# Patient Record
Sex: Female | Born: 1950 | Race: White | Hispanic: No | Smoking: Former smoker
Health system: Southern US, Community
[De-identification: ages and names within clinical notes are randomized; demographics above are authoritative.]

## PROBLEM LIST (undated history)

## (undated) DIAGNOSIS — M1990 Unspecified osteoarthritis, unspecified site: Secondary | ICD-10-CM

## (undated) DIAGNOSIS — H9190 Unspecified hearing loss, unspecified ear: Secondary | ICD-10-CM

## (undated) HISTORY — PX: ABDOMINAL HYSTERECTOMY: SHX81

## (undated) HISTORY — PX: COLONOSCOPY: SHX174

---

## 2019-07-29 ENCOUNTER — Ambulatory Visit (INDEPENDENT_AMBULATORY_CARE_PROVIDER_SITE_OTHER): Payer: Medicare Other | Admitting: Physician Assistant

## 2019-07-29 ENCOUNTER — Encounter: Payer: Self-pay | Admitting: Physician Assistant

## 2019-07-29 ENCOUNTER — Other Ambulatory Visit: Payer: Self-pay

## 2019-07-29 DIAGNOSIS — L820 Inflamed seborrheic keratosis: Secondary | ICD-10-CM

## 2019-07-29 DIAGNOSIS — Z1283 Encounter for screening for malignant neoplasm of skin: Secondary | ICD-10-CM

## 2019-07-29 NOTE — Progress Notes (Signed)
   Follow-Up Visit   Subjective  Jessica Hardy is a 69 y.o. female who presents for the following: Annual Exam (no new concerns).   The following portions of the chart were reviewed this encounter and updated as appropriate: Tobacco  Allergies  Meds  Problems  Med Hx  Surg Hx  Fam Hx      Objective  Well appearing patient in no apparent distress; mood and affect are within normal limits.  A full examination was performed including scalp, head, eyes, ears, nose, lips, neck, chest, axillae, abdomen, back, buttocks, bilateral upper extremities, bilateral lower extremities, hands, feet, fingers, toes, fingernails, and toenails. All findings within normal limits unless otherwise noted below.  Objective  Head - Anterior (Face) (2), Left Buccal Cheek , Left Parotid Area, Left Posterior Mandible, Right Anterior Mandible, Right Parotid Area (2): Erythematous stuck-on, waxy papule or plaque.    Assessment & Plan  Inflamed seborrheic keratosis (8) Head - Anterior (Face) (2); Left Parotid Area; Right Parotid Area (2); Left Buccal Cheek ; Left Posterior Mandible; Right Anterior Mandible  Destruction of lesion - Head - Anterior (Face) (2), Left Buccal Cheek , Left Parotid Area, Left Posterior Mandible, Right Anterior Mandible, Right Parotid Area (2) Complexity: simple   Destruction method: cryotherapy   Informed consent: discussed and consent obtained   Timeout:  patient name, date of birth, surgical site, and procedure verified Lesion destroyed using liquid nitrogen: Yes   Cryotherapy cycles:  1 Outcome: patient tolerated procedure well with no complications   Post-procedure details: wound care instructions given    No atypical nevi noted at the time of the visit.

## 2019-07-29 NOTE — Patient Instructions (Signed)

## 2020-07-14 ENCOUNTER — Encounter: Payer: Self-pay | Admitting: Orthopaedic Surgery

## 2020-07-14 ENCOUNTER — Ambulatory Visit (INDEPENDENT_AMBULATORY_CARE_PROVIDER_SITE_OTHER): Payer: Medicare Other

## 2020-07-14 ENCOUNTER — Ambulatory Visit (INDEPENDENT_AMBULATORY_CARE_PROVIDER_SITE_OTHER): Payer: Medicare Other | Admitting: Orthopaedic Surgery

## 2020-07-14 DIAGNOSIS — M1711 Unilateral primary osteoarthritis, right knee: Secondary | ICD-10-CM | POA: Diagnosis not present

## 2020-07-14 DIAGNOSIS — G8929 Other chronic pain: Secondary | ICD-10-CM | POA: Diagnosis not present

## 2020-07-14 DIAGNOSIS — M25561 Pain in right knee: Secondary | ICD-10-CM | POA: Diagnosis not present

## 2020-07-14 NOTE — Progress Notes (Signed)
Office Visit Note   Patient: Jessica Hardy           Date of Birth: July 11, 1950           MRN: [id number] Visit Date: 07/14/2020              Requested by: No referring provider defined for this encounter. PCP: Patient, No Pcp Per   Assessment & Plan: Visit Diagnoses:  1. Primary osteoarthritis of right knee     Plan: Patient is failed conservative treatment which is included time, NSAIDs, cortisone injections and supplemental injections in the right knee and still having pain that affects her activities of daily living.  Recommend right total knee arthroplasty.  Patient is agreeable.  Risk benefits of surgery discussed with patient at length by Dr. Magnus Ivan and myself.  Risk reviewed with the patient and her husband is present throughout the exam and include but are not limited to DVT/PE, wound healing problems, prolonged pain, infection, nerve vessel injury, and blood loss.  She wishes to proceed with surgery in the near future.  We will work on scheduling her.  See her back 2 weeks.  Knee model was shown to the patient by Dr. Magnus Ivan today.  Follow-Up Instructions: Return 2 weeks postop.   Orders:  Orders Placed This Encounter  Procedures  . XR Knee 1-2 Views Right   No orders of the defined types were placed in this encounter.     Procedures: No procedures performed   Clinical Data: No additional findings.   Subjective: Chief Complaint  Patient presents with  . Right Knee - Pain    HPI Jessica Hardy is a pleasant 70 year old female were seen for the first time for right knee pain has been ongoing for approximately 10 years.  She has had cortisone injections in her knees and also supplemental injections.  She states these really were not beneficial she had significant pain after the gel injection.  She is taken meloxicam for her knee but is having problems now with the knee not straightening completely.  She has daily pain in the knee.  She does work on her farm  raising vegetables implants.  She uses no assistive device to ambulate.  She has had no known injury to the knee.  Overall healthy with some hypercholesterolemia.  History of hysterectomy without issues.  Otherwise no known medical problems.  She does state that she is sees her primary care doctor yearly.  Review of Systems Negative for fevers chills.  Please see HPI  Objective: Vital Signs: There were no vitals taken for this visit.  Physical Exam General: Well-developed well-nourished pleasant female in no acute distress. Psych: Alert and oriented x3 Ortho Exam Right knee she lacks full extension by 3 to 5 degrees.  She has full flexion.  No instability with stressing of the knee.  Slight patellofemoral crepitus right knee.  Slight effusion right knee.  No abnormal warmth erythema of the right knee.  Left knee normal exam with full range of motion without pain. Specialty Comments:  No specialty comments available.  Imaging: XR Knee 1-2 Views Right  Result Date: 07/14/2020 Right knee 2 views: No acute fractures.  Severe patellar femoral joint. Near bone on bone medial compartment. Knee is well located.     PMFS History: Patient Active Problem List   Diagnosis Date Noted  . Primary osteoarthritis of right knee 07/14/2020   No past medical history on file.  No family history on file.  No past  surgical history on file. Social History   Occupational History  . Not on file  Tobacco Use  . Smoking status: Former Smoker    Quit date: 2014    Years since quitting: 8.1  . Smokeless tobacco: Never Used  Vaping Use  . Vaping Use: Never used  Substance and Sexual Activity  . Alcohol use: Never  . Drug use: Never  . Sexual activity: Not on file

## 2020-08-12 ENCOUNTER — Other Ambulatory Visit: Payer: Self-pay

## 2020-08-15 ENCOUNTER — Other Ambulatory Visit: Payer: Self-pay | Admitting: Physician Assistant

## 2020-08-19 ENCOUNTER — Other Ambulatory Visit: Payer: Self-pay | Admitting: Physician Assistant

## 2020-08-19 NOTE — Progress Notes (Addendum)
Surgical Instructions    Your procedure is scheduled on Tuesday, 08/24/20.  Report to [facility] Main Entrance "A" at 12:30 P.M., then check in with the Admitting office.  Call this number if you have problems the morning of surgery:  [phone]   If you have any questions prior to your surgery date call [phone]: Open Monday-Friday 8am-4pm    Remember:  Do not eat after midnight the night before your surgery -Monday  You may drink clear liquids until 11:30 AM the morning of your surgery-Tuesday.   Clear liquids allowed are: Water, Non-Citrus Juices (without pulp), Carbonated Beverages, Clear Tea, Black Coffee Only, and Gatorade   Please complete your PRE-SURGERY ENSURE that was provided to you by 11:30 AM the morning of surgery.  Please, if able, drink it in one setting. DO NOT SIP.   Take these medicines the morning of surgery with A SIP OF WATER: acyclovir (ZOVIRAX) if needed simvastatin (ZOCOR)  estradiol (ESTRACE)   As of today, STOP taking any Aspirin (unless otherwise instructed by your surgeon) Aleve, Naproxen, Ibuprofen, Motrin, Advil, Goody's, BC's, all herbal medications, fish oil, and all vitamins.                     Do not wear jewelry, make up, or nail polish            Do not wear lotions, powders, perfumes, or deodorant.            Do not shave 48 hours prior to surgery.            Do not bring valuables to the hospital.            [facility] is not responsible for any belongings or valuables.  Do NOT Smoke (Tobacco/Vaping) or drink Alcohol 24 hours prior to your procedure If you use a CPAP at night, you may bring all equipment for your overnight stay.   Contacts, glasses, dentures or bridgework may not be worn into surgery, please bring cases for these belongings   For patients admitted to the hospital, discharge time will be determined by your treatment team.     Special instructions:   [facility]- Preparing For Surgery  Before surgery, you can  play an important role. Because skin is not sterile, your skin needs to be as free of germs as possible. You can reduce the number of germs on your skin by washing with CHG (chlorahexidine gluconate) Soap before surgery.  CHG is an antiseptic cleaner which kills germs and bonds with the skin to continue killing germs even after washing.    Oral Hygiene is also important to reduce your risk of infection.  Remember - BRUSH YOUR TEETH THE MORNING OF SURGERY WITH YOUR REGULAR TOOTHPASTE  Please do not use if you have an allergy to CHG or antibacterial soaps. If your skin becomes reddened/irritated stop using the CHG.  Do not shave (including legs and underarms) for at least 48 hours prior to first CHG shower. It is OK to shave your face.  Please follow these instructions carefully.   1. Shower the NIGHT BEFORE SURGERY-Monday and the MORNING OF SURGERY-Tuesday  2. If you chose to wash your hair, wash your hair first as usual with your normal shampoo.  3. After you shampoo, rinse your hair and body thoroughly to remove the shampoo.  4. Wash Face and genitals (private parts) with your normal soap.   5.  Shower the NIGHT BEFORE SURGERY and the [facility]  OF SURGERY with CHG Soap.   6. Use CHG Soap as you would any other liquid soap. You can apply CHG directly to the skin and wash gently with a scrungie or a clean washcloth.   7. Apply the CHG Soap to your body ONLY FROM THE NECK DOWN.  Do not use on open wounds or open sores. Avoid contact with your eyes, ears, mouth and genitals (private parts). Wash Face and genitals (private parts)  with your normal soap.   8. Wash thoroughly, paying special attention to the area where your surgery will be performed.  9. Thoroughly rinse your body with warm water from the neck down.  10. DO NOT shower/wash with your normal soap after using and rinsing off the CHG Soap.  11. Pat yourself dry with a CLEAN TOWEL.  12. Wear CLEAN PAJAMAS to bed the night before  surgery  13. Place CLEAN SHEETS on your bed the night before your surgery  14. DO NOT SLEEP WITH PETS.   Day of Surgery: Take a shower with CHG soap. Wear Clean/Comfortable clothing the morning of surgery Do not apply any deodorants/lotions.   Remember to brush your teeth WITH YOUR REGULAR TOOTHPASTE.   Please read over the following fact sheets that you were given.

## 2020-08-19 NOTE — Progress Notes (Signed)
PCP - Dr Leana Roe Cardiologist - n/a Internal Med - Diane  Chest x-ray - n/a EKG - n/a Stress Test - n/a ECHO - n/a Cardiac Cath - n/a  ERAS: Clear liquids til 11:30 AM day of surgery.  Please complete your PRE-SURGERY ENSURE that was provided to you by 11:30 AM the morning of surgery.  Please, if able, drink it in one setting. DO NOT SIP.  STOP now taking any Aspirin (unless otherwise instructed by your surgeon), Aleve, Naproxen, Ibuprofen, Motrin, Advil, Goody's, BC's, all herbal medications, fish oil, and all vitamins.   Coronavirus Screening Covid test is scheduled on Friday, 08/20/20 Do you have any of the following symptoms:  Cough yes/no: No Fever (>100.83F)  yes/no: No Runny nose yes/no: No Sore throat yes/no: No Difficulty breathing/shortness of breath  yes/no: No  Have you traveled in the last 14 days and where? yes/no: No  Patient verbalized understanding of instructions that were given to them at the PAT appointment.

## 2020-08-20 ENCOUNTER — Encounter (HOSPITAL_COMMUNITY): Payer: Self-pay

## 2020-08-20 ENCOUNTER — Encounter (HOSPITAL_COMMUNITY)
Admission: RE | Admit: 2020-08-20 | Discharge: 2020-08-20 | Disposition: A | Discharge status: Home / Self Care | Payer: Medicare Other | Source: Ambulatory Visit | Attending: Orthopaedic Surgery | Admitting: Orthopaedic Surgery

## 2020-08-20 ENCOUNTER — Other Ambulatory Visit: Payer: Self-pay

## 2020-08-20 DIAGNOSIS — Z01812 Encounter for preprocedural laboratory examination: Secondary | ICD-10-CM | POA: Insufficient documentation

## 2020-08-20 DIAGNOSIS — Z20822 Contact with and (suspected) exposure to covid-19: Secondary | ICD-10-CM | POA: Diagnosis not present

## 2020-08-20 HISTORY — DX: Unspecified hearing loss, unspecified ear: H91.90

## 2020-08-20 HISTORY — DX: Unspecified osteoarthritis, unspecified site: M19.90

## 2020-08-20 LAB — SARS CORONAVIRUS 2 (TAT 6-24 HRS): SARS Coronavirus 2: NEGATIVE

## 2020-08-20 LAB — SURGICAL PCR SCREEN
MRSA, PCR: NEGATIVE
Staphylococcus aureus: NEGATIVE

## 2020-08-23 ENCOUNTER — Telehealth: Payer: Self-pay

## 2020-08-23 NOTE — Telephone Encounter (Signed)
Called and informed.

## 2020-08-23 NOTE — Telephone Encounter (Signed)
Please advise  Victorino Dike with [facility] OR wants to know how many units of blood Dr. Magnus Ivan wants for this pt's case. Would like cb with this information.  CB# [phone]

## 2020-08-23 NOTE — Telephone Encounter (Signed)
I called and pt has a positive antibody screen . They want to make sure you think 2 units is enough.

## 2020-08-23 NOTE — Telephone Encounter (Signed)
I am not sure what they are talking about.  Usually we do not request blood available for elective primary total joint replacement surgery unless there is a specific issue that I am unaware of.

## 2020-08-23 NOTE — Telephone Encounter (Signed)
Yes.  2 units will be enough.

## 2020-08-24 ENCOUNTER — Encounter (HOSPITAL_COMMUNITY)
Admission: RE | Disposition: A | Discharge status: Home / Self Care | Payer: Self-pay | Source: Home / Self Care | Attending: Orthopaedic Surgery

## 2020-08-24 ENCOUNTER — Other Ambulatory Visit: Payer: Self-pay

## 2020-08-24 ENCOUNTER — Observation Stay (HOSPITAL_COMMUNITY): Payer: Medicare Other

## 2020-08-24 ENCOUNTER — Ambulatory Visit (HOSPITAL_COMMUNITY): Payer: Medicare Other | Admitting: Vascular Surgery

## 2020-08-24 ENCOUNTER — Ambulatory Visit (HOSPITAL_COMMUNITY): Payer: Medicare Other | Admitting: Certified Registered Nurse Anesthetist

## 2020-08-24 ENCOUNTER — Observation Stay (HOSPITAL_COMMUNITY)
Admission: RE | Admit: 2020-08-24 | Discharge: 2020-08-25 | Disposition: A | Discharge status: Home / Self Care | Payer: Medicare Other | Attending: Orthopaedic Surgery | Admitting: Orthopaedic Surgery

## 2020-08-24 ENCOUNTER — Encounter (HOSPITAL_COMMUNITY): Payer: Self-pay | Admitting: Orthopaedic Surgery

## 2020-08-24 DIAGNOSIS — M1711 Unilateral primary osteoarthritis, right knee: Secondary | ICD-10-CM | POA: Diagnosis present

## 2020-08-24 DIAGNOSIS — Z96651 Presence of right artificial knee joint: Secondary | ICD-10-CM

## 2020-08-24 DIAGNOSIS — Z87891 Personal history of nicotine dependence: Secondary | ICD-10-CM | POA: Diagnosis not present

## 2020-08-24 HISTORY — PX: TOTAL KNEE ARTHROPLASTY: SHX125

## 2020-08-24 LAB — BASIC METABOLIC PANEL
Anion gap: 8 (ref 5–15)
BUN: 17 mg/dL (ref 8–23)
CO2: 23 mmol/L (ref 22–32)
Calcium: 8.7 mg/dL — ABNORMAL LOW (ref 8.9–10.3)
Chloride: 107 mmol/L (ref 98–111)
Creatinine, Ser: 0.71 mg/dL (ref 0.44–1.00)
GFR, Estimated: >60 mL/min (ref >60)
Glucose, Bld: 93 mg/dL (ref 70–99)
Potassium: 4 mmol/L (ref 3.5–5.1)
Sodium: 138 mmol/L (ref 135–145)

## 2020-08-24 LAB — CBC
HCT: 38.4 % (ref 36.0–46.0)
Hemoglobin: 12.2 g/dL (ref 12.0–15.0)
MCH: 33.3 pg (ref 26.0–34.0)
MCHC: 31.8 g/dL (ref 30.0–36.0)
MCV: 104.9 fL — ABNORMAL HIGH (ref 80.0–100.0)
Platelets: 235 10*3/uL (ref 150–400)
RBC: 3.66 MIL/uL — ABNORMAL LOW (ref 3.87–5.11)
RDW: 12 % (ref 11.5–15.5)
WBC: 8.8 10*3/uL (ref 4.0–10.5)
nRBC: 0 % (ref 0.0–0.2)

## 2020-08-24 LAB — TYPE AND SCREEN
ABO/RH(D): O POS
Antibody Screen: POSITIVE

## 2020-08-24 SURGERY — ARTHROPLASTY, KNEE, TOTAL
Anesthesia: Spinal | Site: Knee | Laterality: Right

## 2020-08-24 MED ORDER — ONDANSETRON HCL 4 MG PO TABS: 4.0000 mg | ORAL_TABLET | Freq: Four times a day (QID) | ORAL | Status: DC | PRN

## 2020-08-24 MED ORDER — METOCLOPRAMIDE HCL 5 MG/ML IJ SOLN: 5.0000 mg | Freq: Three times a day (TID) | INTRAMUSCULAR | Status: DC | PRN

## 2020-08-24 MED ORDER — ACETAMINOPHEN 500 MG PO TABS: 1000.0000 mg | ORAL_TABLET | Freq: Once | ORAL | Status: DC

## 2020-08-24 MED ORDER — BUPIVACAINE IN DEXTROSE 0.75-8.25 % IT SOLN
INTRATHECAL | Status: DC | PRN
  Administered 2020-08-24: 1.6 mL via INTRATHECAL

## 2020-08-24 MED ORDER — AMISULPRIDE (ANTIEMETIC) 5 MG/2ML IV SOLN
10.0000 mg | Freq: Once | INTRAVENOUS | Status: DC | PRN
Start: 2020-08-24 — End: 2020-08-24

## 2020-08-24 MED ORDER — DOCUSATE SODIUM 100 MG PO CAPS
100.0000 mg | ORAL_CAPSULE | Freq: Two times a day (BID) | ORAL | Status: DC
  Administered 2020-08-24 – 2020-08-25 (×2): 100 mg via ORAL
  Filled 2020-08-24 (×2): qty 1

## 2020-08-24 MED ORDER — FENTANYL CITRATE (PF) 100 MCG/2ML IJ SOLN: 100.0000 ug | Freq: Once | INTRAMUSCULAR | Status: AC

## 2020-08-24 MED ORDER — CLONIDINE HCL (ANALGESIA) 100 MCG/ML EP SOLN
EPIDURAL | Status: DC | PRN
  Administered 2020-08-24: 50 ug

## 2020-08-24 MED ORDER — SODIUM CHLORIDE 0.9 % IV SOLN: INTRAVENOUS | Status: DC

## 2020-08-24 MED ORDER — TRANEXAMIC ACID-NACL 1000-0.7 MG/100ML-% IV SOLN
INTRAVENOUS | Status: AC
  Filled 2020-08-24: qty 100

## 2020-08-24 MED ORDER — SIMVASTATIN 20 MG PO TABS
40.0000 mg | ORAL_TABLET | Freq: Every day | ORAL | Status: DC
  Administered 2020-08-24: 40 mg via ORAL
  Filled 2020-08-24: qty 2

## 2020-08-24 MED ORDER — GLYCOPYRROLATE 0.2 MG/ML IJ SOLN
INTRAMUSCULAR | Status: DC | PRN
  Administered 2020-08-24: .2 mg via INTRAVENOUS

## 2020-08-24 MED ORDER — TRANEXAMIC ACID-NACL 1000-0.7 MG/100ML-% IV SOLN
1000.0000 mg | INTRAVENOUS | Status: AC
  Administered 2020-08-24: 1000 mg via INTRAVENOUS

## 2020-08-24 MED ORDER — METHOCARBAMOL 500 MG PO TABS: 500.0000 mg | ORAL_TABLET | Freq: Four times a day (QID) | ORAL | Status: DC | PRN

## 2020-08-24 MED ORDER — PROPOFOL 500 MG/50ML IV EMUL
INTRAVENOUS | Status: DC | PRN
  Administered 2020-08-24: 75 ug/kg/min via INTRAVENOUS

## 2020-08-24 MED ORDER — MENTHOL 3 MG MT LOZG: 1.0000 | LOZENGE | OROMUCOSAL | Status: DC | PRN

## 2020-08-24 MED ORDER — ACETAMINOPHEN 325 MG PO TABS
325.0000 mg | ORAL_TABLET | Freq: Four times a day (QID) | ORAL | Status: DC | PRN
Start: 2020-08-25 — End: 2020-08-25

## 2020-08-24 MED ORDER — CHLORHEXIDINE GLUCONATE 0.12 % MT SOLN
OROMUCOSAL | Status: AC
  Administered 2020-08-24: 15 mL via OROMUCOSAL
  Filled 2020-08-24: qty 15

## 2020-08-24 MED ORDER — LACTATED RINGERS IV SOLN: INTRAVENOUS | Status: DC

## 2020-08-24 MED ORDER — CHLORHEXIDINE GLUCONATE 0.12 % MT SOLN: 15.0000 mL | Freq: Once | OROMUCOSAL | Status: AC

## 2020-08-24 MED ORDER — METHOCARBAMOL 1000 MG/10ML IJ SOLN
500.0000 mg | Freq: Four times a day (QID) | INTRAVENOUS | Status: DC | PRN
  Filled 2020-08-24: qty 5

## 2020-08-24 MED ORDER — FENTANYL CITRATE (PF) 100 MCG/2ML IJ SOLN
INTRAMUSCULAR | Status: AC
  Administered 2020-08-24: 50 ug via INTRAVENOUS
  Filled 2020-08-24: qty 2

## 2020-08-24 MED ORDER — OXYCODONE HCL 5 MG PO TABS
5.0000 mg | ORAL_TABLET | ORAL | Status: DC | PRN
  Administered 2020-08-24 – 2020-08-25 (×3): 10 mg via ORAL
  Filled 2020-08-24 (×3): qty 2

## 2020-08-24 MED ORDER — PANTOPRAZOLE SODIUM 40 MG PO TBEC
40.0000 mg | DELAYED_RELEASE_TABLET | Freq: Every day | ORAL | Status: DC
  Administered 2020-08-25: 40 mg via ORAL
  Filled 2020-08-24: qty 1

## 2020-08-24 MED ORDER — HYDROMORPHONE HCL 1 MG/ML IJ SOLN
0.5000 mg | INTRAMUSCULAR | Status: DC | PRN
Start: 2020-08-24 — End: 2020-08-25

## 2020-08-24 MED ORDER — CEFAZOLIN SODIUM-DEXTROSE 2-4 GM/100ML-% IV SOLN
2.0000 g | INTRAVENOUS | Status: AC
  Administered 2020-08-24: 2 g via INTRAVENOUS

## 2020-08-24 MED ORDER — MIDAZOLAM HCL 2 MG/2ML IJ SOLN: 1.0000 mg | Freq: Once | INTRAMUSCULAR | Status: AC

## 2020-08-24 MED ORDER — VITAMIN D 25 MCG (1000 UNIT) PO TABS
1000.0000 [IU] | ORAL_TABLET | Freq: Every day | ORAL | Status: DC
  Administered 2020-08-25: 1000 [IU] via ORAL
  Filled 2020-08-24: qty 1

## 2020-08-24 MED ORDER — DEXAMETHASONE SODIUM PHOSPHATE 10 MG/ML IJ SOLN
INTRAMUSCULAR | Status: DC | PRN
  Administered 2020-08-24: 5 mg via INTRAVENOUS

## 2020-08-24 MED ORDER — FLUOXETINE HCL 20 MG PO CAPS: 20.0000 mg | ORAL_CAPSULE | Freq: Every day | ORAL | Status: DC

## 2020-08-24 MED ORDER — FENTANYL CITRATE (PF) 100 MCG/2ML IJ SOLN: 25.0000 ug | INTRAMUSCULAR | Status: DC | PRN

## 2020-08-24 MED ORDER — ONDANSETRON HCL 4 MG/2ML IJ SOLN
INTRAMUSCULAR | Status: DC | PRN
  Administered 2020-08-24: 4 mg via INTRAVENOUS

## 2020-08-24 MED ORDER — PHENOL 1.4 % MT LIQD: 1.0000 | OROMUCOSAL | Status: DC | PRN

## 2020-08-24 MED ORDER — DIPHENHYDRAMINE HCL 12.5 MG/5ML PO ELIX: 12.5000 mg | ORAL_SOLUTION | ORAL | Status: DC | PRN

## 2020-08-24 MED ORDER — METOCLOPRAMIDE HCL 5 MG PO TABS
5.0000 mg | ORAL_TABLET | Freq: Three times a day (TID) | ORAL | Status: DC | PRN
Start: 2020-08-24 — End: 2020-08-25

## 2020-08-24 MED ORDER — ONDANSETRON HCL 4 MG/2ML IJ SOLN: 4.0000 mg | Freq: Four times a day (QID) | INTRAMUSCULAR | Status: DC | PRN

## 2020-08-24 MED ORDER — ESTRADIOL 1 MG PO TABS
0.5000 mg | ORAL_TABLET | Freq: Every day | ORAL | Status: DC
  Administered 2020-08-25: 0.5 mg via ORAL
  Filled 2020-08-24: qty 0.5

## 2020-08-24 MED ORDER — MIDAZOLAM HCL 2 MG/2ML IJ SOLN
INTRAMUSCULAR | Status: AC
  Administered 2020-08-24: 1 mg via INTRAVENOUS
  Filled 2020-08-24: qty 2

## 2020-08-24 MED ORDER — CEFAZOLIN SODIUM-DEXTROSE 2-4 GM/100ML-% IV SOLN
INTRAVENOUS | Status: AC
  Filled 2020-08-24: qty 100

## 2020-08-24 MED ORDER — PHENYLEPHRINE HCL (PRESSORS) 10 MG/ML IV SOLN
INTRAVENOUS | Status: DC | PRN
  Administered 2020-08-24 (×2): 100 ug via INTRAVENOUS

## 2020-08-24 MED ORDER — CEFAZOLIN SODIUM-DEXTROSE 1-4 GM/50ML-% IV SOLN
1.0000 g | Freq: Four times a day (QID) | INTRAVENOUS | Status: AC
  Administered 2020-08-24 – 2020-08-25 (×2): 1 g via INTRAVENOUS
  Filled 2020-08-24 (×2): qty 50

## 2020-08-24 MED ORDER — ASPIRIN 81 MG PO CHEW
81.0000 mg | CHEWABLE_TABLET | Freq: Two times a day (BID) | ORAL | Status: DC
  Administered 2020-08-24 – 2020-08-25 (×2): 81 mg via ORAL
  Filled 2020-08-24 (×2): qty 1

## 2020-08-24 MED ORDER — ORAL CARE MOUTH RINSE
15.0000 mL | Freq: Once | OROMUCOSAL | Status: AC
Start: 2020-08-24 — End: 2020-08-24

## 2020-08-24 MED ORDER — ALUM & MAG HYDROXIDE-SIMETH 200-200-20 MG/5ML PO SUSP: 30.0000 mL | ORAL | Status: DC | PRN

## 2020-08-24 MED ORDER — POVIDONE-IODINE 10 % EX SWAB
2.0000 "application " | Freq: Once | CUTANEOUS | Status: AC
  Administered 2020-08-24: 2 via TOPICAL

## 2020-08-24 MED ORDER — OXYCODONE HCL 5 MG PO TABS
10.0000 mg | ORAL_TABLET | ORAL | Status: DC | PRN
Start: 2020-08-24 — End: 2020-08-25

## 2020-08-24 MED ORDER — PHENYLEPHRINE HCL-NACL 10-0.9 MG/250ML-% IV SOLN
INTRAVENOUS | Status: DC | PRN
  Administered 2020-08-24: 50 ug/min via INTRAVENOUS

## 2020-08-24 MED ORDER — ROPIVACAINE HCL 5 MG/ML IJ SOLN
INTRAMUSCULAR | Status: DC | PRN
  Administered 2020-08-24: 20 mL via PERINEURAL

## 2020-08-24 SURGICAL SUPPLY — 63 items
BANDAGE ESMARK 6X9 LF (GAUZE/BANDAGES/DRESSINGS) ×1 IMPLANT
BLADE SAG 18X100X1.27 (BLADE) ×2 IMPLANT
BNDG ELASTIC 6X5.8 VLCR STR LF (GAUZE/BANDAGES/DRESSINGS) ×6 IMPLANT
BNDG ESMARK 6X9 LF (GAUZE/BANDAGES/DRESSINGS) ×2
BOWL SMART MIX CTS (DISPOSABLE) ×2 IMPLANT
COVER SURGICAL LIGHT HANDLE (MISCELLANEOUS) ×2 IMPLANT
COVER WAND RF STERILE (DRAPES) ×2 IMPLANT
CUFF TOURN SGL QUICK 34 (TOURNIQUET CUFF) ×1
CUFF TOURN SGL QUICK 42 (TOURNIQUET CUFF) IMPLANT
CUFF TRNQT CYL 34X4.125X (TOURNIQUET CUFF) ×1 IMPLANT
DRAPE EXTREMITY T 121X128X90 (DISPOSABLE) ×2 IMPLANT
DRAPE HALF SHEET 40X57 (DRAPES) ×2 IMPLANT
DRAPE U-SHAPE 47X51 STRL (DRAPES) ×2 IMPLANT
DRSG PAD ABDOMINAL 8X10 ST (GAUZE/BANDAGES/DRESSINGS) ×2 IMPLANT
DURAPREP 26ML APPLICATOR (WOUND CARE) ×2 IMPLANT
ELECT CAUTERY BLADE 6.4 (BLADE) ×2 IMPLANT
ELECT REM PT RETURN 9FT ADLT (ELECTROSURGICAL) ×2
ELECTRODE REM PT RTRN 9FT ADLT (ELECTROSURGICAL) ×1 IMPLANT
FACESHIELD WRAPAROUND (MASK) ×4 IMPLANT
FEMORAL POSTERIOR SZ3 RT (Joint) ×1 IMPLANT
GAUZE SPONGE 4X4 12PLY STRL (GAUZE/BANDAGES/DRESSINGS) ×2 IMPLANT
GAUZE XEROFORM 1X8 LF (GAUZE/BANDAGES/DRESSINGS) ×2 IMPLANT
GLOVE ORTHO TXT STRL SZ7.5 (GLOVE) ×2 IMPLANT
GLOVE SRG 8 PF TXTR STRL LF DI (GLOVE) ×2 IMPLANT
GLOVE SURG ORTHO 8.0 STRL STRW (GLOVE) ×2 IMPLANT
GLOVE SURG UNDER POLY LF SZ8 (GLOVE) ×2
GOWN STRL REUS W/ TWL LRG LVL3 (GOWN DISPOSABLE) IMPLANT
GOWN STRL REUS W/ TWL XL LVL3 (GOWN DISPOSABLE) ×2 IMPLANT
GOWN STRL REUS W/TWL LRG LVL3 (GOWN DISPOSABLE)
GOWN STRL REUS W/TWL XL LVL3 (GOWN DISPOSABLE) ×2
HANDPIECE INTERPULSE COAX TIP (DISPOSABLE) ×1
IMMOBILIZER KNEE 22 UNIV (SOFTGOODS) ×4 IMPLANT
INSERT TIBIA BEAR SZ3 9 KNEE (Miscellaneous) ×2 IMPLANT
KIT BASIN OR (CUSTOM PROCEDURE TRAY) ×2 IMPLANT
KIT TURNOVER KIT B (KITS) ×2 IMPLANT
KNEE PATELLA ASYMMETRIC 9X29 (Knees) ×2 IMPLANT
KNEE TIBIAL COMPONENT SZ3 (Knees) ×2 IMPLANT
MANIFOLD NEPTUNE II (INSTRUMENTS) ×2 IMPLANT
NEEDLE 18GX1X1/2 (RX/OR ONLY) (NEEDLE) IMPLANT
NS IRRIG 1000ML POUR BTL (IV SOLUTION) ×2 IMPLANT
PACK TOTAL JOINT (CUSTOM PROCEDURE TRAY) ×2 IMPLANT
PAD ARMBOARD 7.5X6 YLW CONV (MISCELLANEOUS) ×2 IMPLANT
PADDING CAST COTTON 6X4 STRL (CAST SUPPLIES) ×2 IMPLANT
PIN FLUTED HEDLESS FIX 3.5X1/8 (PIN) ×2 IMPLANT
POSTERIOR FEMORAL SZ3 RT (Joint) ×2 IMPLANT
SET HNDPC FAN SPRY TIP SCT (DISPOSABLE) ×1 IMPLANT
SET PAD KNEE POSITIONER (MISCELLANEOUS) ×2 IMPLANT
STAPLER VISISTAT 35W (STAPLE) IMPLANT
STRIP CLOSURE SKIN 1/2X4 (GAUZE/BANDAGES/DRESSINGS) IMPLANT
SUCTION FRAZIER HANDLE 10FR (MISCELLANEOUS) ×1
SUCTION TUBE FRAZIER 10FR DISP (MISCELLANEOUS) ×1 IMPLANT
SUT MNCRL AB 4-0 PS2 18 (SUTURE) IMPLANT
SUT VIC AB 0 CT1 27 (SUTURE) ×1
SUT VIC AB 0 CT1 27XBRD ANBCTR (SUTURE) ×1 IMPLANT
SUT VIC AB 1 CT1 27 (SUTURE) ×2
SUT VIC AB 1 CT1 27XBRD ANBCTR (SUTURE) ×2 IMPLANT
SUT VIC AB 2-0 CT1 27 (SUTURE) ×2
SUT VIC AB 2-0 CT1 TAPERPNT 27 (SUTURE) ×2 IMPLANT
SYR 50ML LL SCALE MARK (SYRINGE) IMPLANT
TOWEL GREEN STERILE (TOWEL DISPOSABLE) ×2 IMPLANT
TOWEL GREEN STERILE FF (TOWEL DISPOSABLE) ×2 IMPLANT
TRAY CATH 16FR W/PLASTIC CATH (SET/KITS/TRAYS/PACK) IMPLANT
WRAP KNEE MAXI GEL POST OP (GAUZE/BANDAGES/DRESSINGS) ×2 IMPLANT

## 2020-08-24 NOTE — Anesthesia Procedure Notes (Signed)
Procedure Name: MAC Date/Time: 08/24/2020 3:10 PM Performed by: Eligha Bridegroom, CRNA Pre-anesthesia Checklist: Patient identified, Emergency Drugs available, Suction available, Patient being monitored and Timeout performed Patient Re-evaluated:Patient Re-evaluated prior to induction Oxygen Delivery Method: Simple face mask Preoxygenation: Pre-oxygenation with 100% oxygen Induction Type: IV induction

## 2020-08-24 NOTE — Anesthesia Preprocedure Evaluation (Addendum)
Anesthesia Evaluation  Patient identified by MRN, date of birth, ID band Patient awake    Reviewed: Allergy & Precautions, NPO status , Patient's Chart, lab work & pertinent test results  Airway Mallampati: II  TM Distance: >3 FB Neck ROM: Full    Dental  (+) Dental Advisory Given   Pulmonary Current Smoker and Patient abstained from smoking.,    breath sounds clear to auscultation       Cardiovascular negative cardio ROS   Rhythm:Regular Rate:Normal     Neuro/Psych negative neurological ROS     GI/Hepatic negative GI ROS, Neg liver ROS,   Endo/Other  negative endocrine ROS  Renal/GU negative Renal ROS     Musculoskeletal  (+) Arthritis ,   Abdominal   Peds  Hematology negative hematology ROS (+)   Anesthesia Other Findings   Reproductive/Obstetrics                            Lab Results  Component Value Date   WBC 8.8 08/24/2020   HGB 12.2 08/24/2020   HCT 38.4 08/24/2020   MCV 104.9 (H) 08/24/2020   PLT 235 08/24/2020   No results found for: CREATININE, BUN, NA, K, CL, CO2  Anesthesia Physical Anesthesia Plan  ASA: II  Anesthesia Plan: Spinal   Post-op Pain Management:  Regional for Post-op pain   Induction:   PONV Risk Score and Plan: 2 and Dexamethasone, Ondansetron and Treatment may vary due to age or medical condition  Airway Management Planned: Natural Airway and Simple Face Mask  Additional Equipment:   Intra-op Plan:   Post-operative Plan:   Informed Consent: I have reviewed the patients History and Physical, chart, labs and discussed the procedure including the risks, benefits and alternatives for the proposed anesthesia with the patient or authorized representative who has indicated his/her understanding and acceptance.       Plan Discussed with: CRNA  Anesthesia Plan Comments:        Anesthesia Quick Evaluation

## 2020-08-24 NOTE — Progress Notes (Deleted)
Received patient from PACU awake,alert.orientedx4 and able to verbalize needs. VSS. NAD noted; respirations easy/even on room air. Dressing to RLE c/d/i; immobilizer in place; able to wiggle toes. Movement/sensation to all extremities noted. Foley in place; c/d/i. SCD placed to LLE at this time. Oriented to room and floor. Whiteboard updated. All safety measures in place and personal belongings within reach.  

## 2020-08-24 NOTE — H&P (Signed)
TOTAL KNEE ADMISSION H&P  Patient is being admitted for left total knee arthroplasty.  Subjective:  Chief Complaint:left knee pain.  HPI: Jessica Hardy, 70 y.o. female, has a history of pain and functional disability in the left knee due to arthritis and has failed non-surgical conservative treatments for greater than 12 weeks to includeNSAID's and/or analgesics, corticosteriod injections, viscosupplementation injections, flexibility and strengthening excercises, use of assistive devices and activity modification.  Onset of symptoms was gradual, starting 10 years ago with gradually worsening course since that time. The patient noted no past surgery on the right knee(s).  Patient currently rates pain in the right knee(s) at 10 out of 10 with activity. Patient has night pain, worsening of pain with activity and weight bearing, pain that interferes with activities of daily living, pain with passive range of motion, crepitus and joint swelling.  Patient has evidence of subchondral sclerosis, periarticular osteophytes and joint space narrowing by imaging studies. There is no active infection.  Patient Active Problem List   Diagnosis Date Noted  . Primary osteoarthritis of right knee 07/14/2020   Past Medical History:  Diagnosis Date  . Arthritis    knee  . 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 ABDOMINAL HYSTERECTOMY    . COLONOSCOPY      Current Facility-Administered Medications  Medication Dose Route Frequency Provider Last Rate Last Admin  . ceFAZolin (ANCEF) 2-4 GM/100ML-% IVPB           . ceFAZolin (ANCEF) IVPB 2g/100 mL premix  2 g Intravenous On Call to OR Kirtland Bouchard, PA-C      . chlorhexidine (PERIDEX) 0.12 % solution 15 mL  15 mL Mouth/Throat Once Marcene Duos, MD       Or  . MEDLINE mouth rinse  15 mL Mouth Rinse Once Marcene Duos, MD      . chlorhexidine (PERIDEX) 0.12 % solution           .  lactated ringers infusion   Intravenous Continuous Marcene Duos, MD      . povidone-iodine 10 % swab 2 application  2 application Topical Once Richardean Canal W, PA-C      . tranexamic acid (CYKLOKAPRON) 1000MG /181mL IVPB           . tranexamic acid (CYKLOKAPRON) IVPB 1,000 mg  1,000 mg Intravenous To OR 80m, PA-C       No Known Allergies  Social History   Tobacco Use  . Smoking status: Former Smoker    Types: Cigarettes    Quit date: 2014    Years since quitting: 8.3  . Smokeless tobacco: Never Used  Substance Use Topics  . Alcohol use: Never    History reviewed. No pertinent family history.   Review of Systems  Musculoskeletal: Positive for gait problem and joint swelling.  All other systems reviewed and are negative.   Objective:  Physical Exam Vitals reviewed.  Constitutional:      Appearance: Normal appearance.  HENT:     Head: Normocephalic and atraumatic.  Eyes:     Extraocular Movements: Extraocular movements intact.     Pupils: Pupils are equal, round, and reactive to light.  Cardiovascular:     Pulses: Normal pulses.  Pulmonary:     Breath sounds: Normal breath sounds.  Abdominal:     Palpations: Abdomen is soft.  Musculoskeletal:     Cervical back: Normal range of motion.  Right knee: Effusion and bony tenderness present. Decreased range of motion. Tenderness present over the medial joint line, lateral joint line and patellar tendon. Abnormal alignment and abnormal meniscus.  Neurological:     Mental Status: She is alert and oriented to person, place, and time.  Psychiatric:        Behavior: Behavior normal.     Vital signs in last 24 hours: Temp:  [98.5 F (36.9 C)] 98.5 F (36.9 C) (04/19 1137) Pulse Rate:  [60] 60 (04/19 1137) Resp:  [17] 17 (04/19 1137) BP: (160)/(71) 160/71 (04/19 1137) SpO2:  [100 %] 100 % (04/19 1137) Weight:  [59 kg] 59 kg (04/19 1137)  Labs:   Estimated body mass index is 23.03 kg/m as  calculated from the following:   Height as of this encounter: 5\' 3"  (1.6 m).   Weight as of this encounter: 59 kg.   Imaging Review Plain radiographs demonstrate severe degenerative joint disease of the right knee(s). The overall alignment ismild varus. The bone quality appears to be good for age and reported activity level.      Assessment/Plan:  End stage arthritis, right knee   The patient history, physical examination, clinical judgment of the provider and imaging studies are consistent with end stage degenerative joint disease of the right knee(s) and total knee arthroplasty is deemed medically necessary. The treatment options including medical management, injection therapy arthroscopy and arthroplasty were discussed at length. The risks and benefits of total knee arthroplasty were presented and reviewed. The risks due to aseptic loosening, infection, stiffness, patella tracking problems, thromboembolic complications and other imponderables were discussed. The patient acknowledged the explanation, agreed to proceed with the plan and consent was signed. Patient is being admitted for inpatient treatment for surgery, pain control, PT, OT, prophylactic antibiotics, VTE prophylaxis, progressive ambulation and ADL's and discharge planning. The patient is planning to be discharged home with home health services

## 2020-08-24 NOTE — Anesthesia Procedure Notes (Signed)
Spinal  Patient location during procedure: OR Start time: 08/24/2020 3:10 PM End time: 08/24/2020 3:15 PM Reason for block: surgical anesthesia Staffing Performed: anesthesiologist  Anesthesiologist: Marcene Duos, MD Preanesthetic Checklist Completed: patient identified, IV checked, site marked, risks and benefits discussed, surgical consent, monitors and equipment checked, pre-op evaluation and timeout performed Spinal Block Patient position: sitting Prep: DuraPrep Patient monitoring: heart rate, cardiac monitor, continuous pulse ox and blood pressure Approach: midline Location: L4-5 Injection technique: single-shot Needle Needle type: Pencan  Needle gauge: 24 G Needle length: 9 cm Assessment Sensory level: T4 Events: CSF return

## 2020-08-24 NOTE — Brief Op Note (Signed)
08/24/2020  4:33 PM  PATIENT:  Jessica Hardy  70 y.o. female  PRE-OPERATIVE DIAGNOSIS:  endstage osteoarthritis right knee  POST-OPERATIVE DIAGNOSIS:  endstage osteoarthritis right knee  PROCEDURE:  Procedure(s) with comments: RIGHT TOTAL KNEE ARTHROPLASTY (Right) - Needs RNFA  SURGEON:  Surgeon(s) and Role:    Kathryne Hitch, MD - Primary  ASSISTANTS: April Greene, RNFA   ANESTHESIA:   regional and spinal  COUNTS:  YES  TOURNIQUET:  * Missing tourniquet times found for documented tourniquets in log: [id number] *  DICTATION: .Other Dictation: Dictation Number [id number]  PLAN OF CARE: Admit for overnight observation  PATIENT DISPOSITION:  PACU - hemodynamically stable.   Delay start of Pharmacological VTE agent (>24hrs) due to surgical blood loss or risk of bleeding: no

## 2020-08-24 NOTE — Progress Notes (Signed)
Received patient from PACU awake,alert.orientedx4 and able to verbalize needs. VSS. NAD noted; respirations easy/even on room air. Dressing to RLE c/d/i; immobilizer in place; able to wiggle toes. Movement/sensation to all extremities noted. Foley in place; c/d/i. SCD placed to LLE at this time. Oriented to room and floor. Whiteboard updated. All safety measures in place and personal belongings within reach.

## 2020-08-24 NOTE — Transfer of Care (Signed)
Immediate Anesthesia Transfer of Care Note  Patient: Jessica Hardy  Procedure(s) Performed: RIGHT TOTAL KNEE ARTHROPLASTY (Right Knee)  Patient Location: PACU  Anesthesia Type:Spinal and MAC combined with regional for post-op pain  Level of Consciousness: awake and alert   Airway & Oxygen Therapy: Patient Spontanous Breathing  Post-op Assessment: Report given to RN and Post -op Vital signs reviewed and stable  Post vital signs: Reviewed and stable  Last Vitals:  Vitals Value Taken Time  BP 121/67 08/24/20 1648  Temp    Pulse 68 08/24/20 1651  Resp 16 08/24/20 1651  SpO2 95 % 08/24/20 1651  Vitals shown include unvalidated device data.  Last Pain:  Vitals:   08/24/20 1455  TempSrc:   PainSc: 0-No pain      Patients Stated Pain Goal: 4 (92/11/94 1740)  Complications: No complications documented.

## 2020-08-24 NOTE — Anesthesia Procedure Notes (Signed)
Anesthesia Regional Block: Adductor canal block   Pre-Anesthetic Checklist: ,, timeout performed, Correct Patient, Correct Site, Correct Laterality, Correct Procedure, Correct Position, site marked, Risks and benefits discussed,  Surgical consent,  Pre-op evaluation,  At surgeon's request and post-op pain management  Laterality: Right  Prep: chloraprep       Needles:  Injection technique: Single-shot  Needle Type: Echogenic Needle     Needle Length: 9cm  Needle Gauge: 21     Additional Needles:   Procedures:,,,, ultrasound used (permanent image in chart),,,,  Narrative:  Start time: 08/24/2020 2:17 PM End time: 08/24/2020 2:22 PM Injection made incrementally with aspirations every 5 mL.  Performed by: Personally  Anesthesiologist: Marcene Duos, MD

## 2020-08-25 ENCOUNTER — Encounter (HOSPITAL_COMMUNITY): Payer: Self-pay | Admitting: Orthopaedic Surgery

## 2020-08-25 ENCOUNTER — Other Ambulatory Visit: Payer: Self-pay | Admitting: Orthopaedic Surgery

## 2020-08-25 DIAGNOSIS — M1711 Unilateral primary osteoarthritis, right knee: Secondary | ICD-10-CM | POA: Diagnosis not present

## 2020-08-25 LAB — CBC
HCT: 36 % (ref 36.0–46.0)
Hemoglobin: 12.1 g/dL (ref 12.0–15.0)
MCH: 34.1 pg — ABNORMAL HIGH (ref 26.0–34.0)
MCHC: 33.6 g/dL (ref 30.0–36.0)
MCV: 101.4 fL — ABNORMAL HIGH (ref 80.0–100.0)
Platelets: 228 10*3/uL (ref 150–400)
RBC: 3.55 MIL/uL — ABNORMAL LOW (ref 3.87–5.11)
RDW: 11.9 % (ref 11.5–15.5)
WBC: 15.9 10*3/uL — ABNORMAL HIGH (ref 4.0–10.5)
nRBC: 0 % (ref 0.0–0.2)

## 2020-08-25 LAB — BASIC METABOLIC PANEL
Anion gap: 8 (ref 5–15)
BUN: 16 mg/dL (ref 8–23)
CO2: 23 mmol/L (ref 22–32)
Calcium: 8.5 mg/dL — ABNORMAL LOW (ref 8.9–10.3)
Chloride: 104 mmol/L (ref 98–111)
Creatinine, Ser: 0.71 mg/dL (ref 0.44–1.00)
GFR, Estimated: >60 mL/min (ref >60)
Glucose, Bld: 127 mg/dL — ABNORMAL HIGH (ref 70–99)
Potassium: 4.2 mmol/L (ref 3.5–5.1)
Sodium: 135 mmol/L (ref 135–145)

## 2020-08-25 MED ORDER — METHOCARBAMOL 500 MG PO TABS: 500.0000 mg | ORAL_TABLET | Freq: Four times a day (QID) | ORAL | 0 refills | Status: DC | PRN

## 2020-08-25 MED ORDER — ASPIRIN 81 MG PO CHEW: 81.0000 mg | CHEWABLE_TABLET | Freq: Two times a day (BID) | ORAL | 0 refills | Status: AC

## 2020-08-25 MED ORDER — OXYCODONE HCL 5 MG PO TABS: 5.0000 mg | ORAL_TABLET | Freq: Four times a day (QID) | ORAL | 0 refills | Status: DC | PRN

## 2020-08-25 NOTE — Anesthesia Postprocedure Evaluation (Signed)
Anesthesia Post Note  Patient: Jessica Hardy  Procedure(s) Performed: RIGHT TOTAL KNEE ARTHROPLASTY (Right Knee)     Patient location during evaluation: PACU Anesthesia Type: Spinal and Regional Level of consciousness: oriented and awake and alert Pain management: pain level controlled Vital Signs Assessment: post-procedure vital signs reviewed and stable Respiratory status: spontaneous breathing and respiratory function stable Cardiovascular status: blood pressure returned to baseline and stable Postop Assessment: no headache, no backache, no apparent nausea or vomiting, spinal receding and patient able to bend at knees Anesthetic complications: no   No complications documented.  Last Vitals:  Vitals:   08/24/20 2023 08/25/20 0332  BP: 131/66 116/62  Pulse: (!) 55 (!) 57  Resp: 16 16  Temp: 36.6 C   SpO2: 95% 94%    Last Pain:  Vitals:   08/25/20 0615  TempSrc:   PainSc: 3                  Bingham Millette,W. EDMOND

## 2020-08-25 NOTE — Op Note (Signed)
NAME: Jessica Hardy, Jessica Hardy MEDICAL RECORD NO: [id number] ACCOUNT NO: [id number] DATE OF BIRTH: 1950/06/13 FACILITY: [facility] LOCATION: [facility] PHYSICIAN: Vanita Panda. Magnus Ivan, MD  Operative Report   DATE OF PROCEDURE: 08/24/2020   PREOPERATIVE DIAGNOSIS:  Primary osteoarthritis and degenerative joint disease, right knee.  POSTOPERATIVE DIAGNOSIS:  Primary osteoarthritis and degenerative joint disease, right knee.  PROCEDURE:  Right total knee arthroplasty.  IMPLANTS:  Stryker Triathlon press fit knee system with size 3 femur, size 3 tibial tray, 9 mm thickness fixed bearing polyethylene insert, size 29 patellar button.  SURGEON:  Doneen Poisson, M.D.  ASSISTANT:  April Chilton Si, RNFA.  ANTIBIOTICS:  2 g IV Ancef.  TOURNIQUET TIME:  Less than 1 hour.  ESTIMATED BLOOD LOSS:  50 mL  COMPLICATIONS:  None.  INDICATIONS:  The patient is a 70 year old female well known to me.  She has debilitating arthritis involving her right knee.  She has tried and failed all forms of conservative treatment.  At this point, her pain is daily and is detrimentally affecting  her mobility, her quality of life and her activities of daily living to the point she does wish to proceed with a total knee arthroplasty.  We have recommended this as well.  We did describe the risk of acute blood loss anemia, nerve or vessel injury,  fracture, infection, DVT, and implant failure.  We talked about our goals being decreased pain, improve mobility and overall improve quality of life.  DESCRIPTION OF PROCEDURE:  After informed consent was obtained, appropriate right knee was marked.  She was brought to the operating room after an adductor canal block was obtained in the right lower extremity in the holding room.  In the operating room.   She was sat up on the operating table where spinal anesthesia was obtained.  She was laid in supine position on the operating table.  Foley catheter was placed and a nonsterile  tourniquet was placed around her upper right thigh.  Her right knee did  have a slight flexion contracture.  Her thigh, knee, leg, ankle and foot were prepped and draped with DuraPrep and sterile drapes including a sterile stockinette.  A timeout was called.  She was identified correct patient, correct right knee.  We then  used Esmarch to wrap that leg and tourniquet was inflated to 300 mm of pressure.  I then made a direct midline incision over the patella and carried this proximally and distally.  We dissected down the knee joint, carried out a medial parapatellar  arthrotomy, finding a very large joint effusion.  With the knee opened up we removed osteophytes from all three compartments as well as remnants of ACL, PCL, medial and lateral meniscus.  With the knee in a flexed position, we used the extramedullary  cutting guide for making our proximal tibia cut correcting for varus and valgus and neutral slope.  We made this cut to take 9 mm off the high side.  It was a thin cut so we backed it down 2 more millimeters and made another cut.  We then went to the  femur and used an intramedullary guide for the femur.  Based off intramedullary guide we set this for making an 8 mm distal femoral cut for a right knee at 5 degrees externally rotated.  After we made that cut we decided to back it down 2 more  millimeters as well.  We then brought the knee back down to full extension and removed more remnants of menisci from the back  of the knee and then placed a 9 mm extension block and achieved full extension.  We then went back to the femur and put our  femoral sizing guide based off the epicondylar axis and Whitesides line.  Based off of this, we chose a size 3 femur.  We put a 4-in-1 cutting block for a size 3 femur, we made our anterior and posterior cuts, followed by our chamfer cuts.  We then made  our femoral box cut.  Attention was then turned back to the tibia, we chose a size 3 tibial tray for coverage,  setting the rotation off the tibial tubercle and the femur.  We made our keel punch off of this easily.  I had good quality bone, so we did  this for a press-fit knee system with a size 3 tibia and followed by the size 3 right femur.  We trialed a 9 mm fixed bearing trial insert and we were pleased with range of motion and stability of the knee assessed mechanically.  We then made our  patellar cut and drilled three holes for size 29 patellar button.  We then removed all instrumentation from the knee and irrigated the knee with normal saline solution using pulsatile lavage.  We then dried the knee real well with the knee in a flexed  position, placed our real Stryker Triathlon tibial press-fit tray, size 3, followed by a real size 3 right press-fit femur.  We placed our 9 mm fixed bearing polyethylene insert for a size 3 tibia and press fit our size 29 patellar button.  I then put  the knee through several cycles of motion.  We were pleased with range of motion and stability.  The tourniquet was let down.  Hemostasis was obtained with electrocautery.  We then closed the arthrotomy with interrupted #1 Vicryl suture followed by 0  Vicryl in the deep tissue and 2-0 Vicryl to close subcutaneous tissue.  The skin was reapproximated with staples.  Xeroform and well-padded sterile dressing was applied.  She was taken to recovery room in stable condition with all final counts being correct.  No complications noted.   SUJ D: 08/24/2020 4:31:12 pm T: 08/25/2020 3:17:00 am  JOB: [id number]/ [id number]

## 2020-08-25 NOTE — TOC Transition Note (Addendum)
Transition of Care [facility]) - CM/SW Discharge Note   Patient Details  Name: Jessica Hardy MRN: [id number] Date of Birth: 07-04-1950  Transition of Care [facility]) CM/SW Contact:  Epifanio Lesches, RN Phone Number: 08/25/2020, 1:37 PM   Clinical Narrative:    Patient will DC to: home Anticipated DC date: 08/25/2020 Family notified: yes Transport by: car         - s/p R TKA on 08/24/20  Per MD patient ready for DC today. RN, patient, and  patient's boyfriend [place] notified of DC. Pt will d/c to boyfriend's residence:[address], [place] [address]. Home health order noted for PT. Pt agreeable to home health services. Choice provided. Pt without preference. Referral initally made with [facility]. [facility] unable to accept 2/2 service area. Referral then made with [facility], acceptance pending.  Pt without DME needs, already has rolling walker and bedside commode. Pt without Rx med concerns.  Boyfriend, Particia Nearing to provide transportation to home.   08/25/2020 @ 1404  Received text from [facility] stating acceptance for home health services.  RNCM will sign off for now as intervention is no longer needed. Please consult [place] again if new needs arise.   Final next level of care: Home w Home Health Services Barriers to Discharge: No Barriers Identified   Patient Goals and CMS Choice     Choice offered to / list presented to : Patient  Discharge Placement                       Discharge Plan and Services                          HH Arranged: PT [facility] Agency: [facility] Date [facility] Agency Contacted: 08/25/20 Time HH Agency Contacted: [phone] Representative spoke with at [facility] Agency: Kandee Keen  Social Determinants of Health (SDOH) Interventions     Readmission Risk Interventions No flowsheet data found.

## 2020-08-25 NOTE — Discharge Instructions (Signed)

## 2020-08-25 NOTE — Progress Notes (Signed)
Pt was given her AVS discharge summary and went over with her. IV was removed with catheter intact. Pt does not need any DME to take home with her. Pt had no further questions, she is waiting for her ride to get here.

## 2020-08-25 NOTE — Progress Notes (Signed)
Physical Therapy Treatment Patient Details Name: Jessica Hardy MRN: [id number] DOB: 1950-12-08 Today's Date: 08/25/2020    History of Present Illness Pt is 70 yo female s/p R TKA on 08/24/20.  She has PMH including OA and  hearing loss    PT Comments    Pt is POD # 1 and is progressing well.  She demonstrates improved R LE strength with no extensor lag - improved stability in standing.  Pt demonstrates safe gait & transfers in order to return home from PT perspective once discharged by MD.  While in hospital, will continue to benefit from PT for skilled therapy to advance mobility and exercises.      Follow Up Recommendations  Follow surgeon's recommendation for DC plan and follow-up therapies;Supervision/Assistance - 24 hour     Equipment Recommendations  None recommended by PT    Recommendations for Other Services       Precautions / Restrictions Precautions Precautions: Fall Required Braces or Orthoses: Knee Immobilizer - Right Knee Immobilizer - Right: Discontinue once straight leg raise with < 10 degree lag Restrictions RLE Weight Bearing: Weight bearing as tolerated    Mobility  Bed Mobility Overal bed mobility: Modified Independent Bed Mobility: Supine to Sit     Supine to sit: Supervision          Transfers Overall transfer level: Needs assistance Equipment used: Rolling walker (2 wheeled) Transfers: Sit to/from Stand Sit to Stand: Supervision         General transfer comment: Cues for safe hand placement and R LE management; performed x 4 throughout session with technique improving and good carry over  Ambulation/Gait Ambulation/Gait assistance: Supervision;Min guard Gait Distance (Feet): 120 Feet Assistive device: Rolling walker (2 wheeled) Gait Pattern/deviations: Step-to pattern Gait velocity: decreased   General Gait Details: Min guard progressed to supervision; good improvement in R LE stability; cues for RW proximity   Stairs Stairs:  Yes Stairs assistance: Min guard Stair Management: Step to pattern;Backwards;Forwards;With walker Number of Stairs: 2 General stair comments: Performed platform step forward with cues and min guard; performed platform step backward with cues and min guard - pt preferred stability of RW in backward   Wheelchair Mobility    Modified Rankin (Stroke Patients Only)       Balance Overall balance assessment: Needs assistance Sitting-balance support: No upper extremity supported Sitting balance-Leahy Scale: Normal     Standing balance support: Bilateral upper extremity supported;No upper extremity supported Standing balance-Leahy Scale: Fair Standing balance comment: RW for ambulation.  Able to balance on L leg and wash hands                            Cognition Arousal/Alertness: Awake/alert Behavior During Therapy: WFL for tasks assessed/performed Overall Cognitive Status: Within Functional Limits for tasks assessed                                        Exercises Total Joint Exercises Ankle Circles/Pumps: AROM;Both;10 reps;Supine Quad Sets: AROM;Both;10 reps;Supine Towel Squeeze: AROM;Both;10 reps;Supine Heel Slides: AAROM;Right;10 reps;Supine Hip ABduction/ADduction: 10 reps;Right;Supine;AROM Long Arc Quad: AROM;Right;10 reps;Supine Knee Flexion: AROM;Right;10 reps;Supine Goniometric ROM: R knee 5 to 90 degrees    General Comments Educated on safe ice use, no pivots, car transfers, resting with leg straight, and TED hose during day. Also, encouraged walking every 1-2 hours during day. Educated on  HEP with focus on mobility the first weeks. Discussed doing exercises within pain control and if pain increasing could decreased ROM, reps, and stop exercises as needed. Encouraged to perform quad sets and ankle pumps frequently for blood flow and to promote full knee extension.      Pertinent Vitals/Pain Pain Assessment: 0-10 Pain Score: 2  Pain  Location: R knee Pain Descriptors / Indicators: Discomfort Pain Intervention(s): Limited activity within patient's tolerance;Monitored during session    Home Living                      Prior Function            PT Goals (current goals can now be found in the care plan section) Acute Rehab PT Goals Patient Stated Goal: return home PT Goal Formulation: With patient Time For Goal Achievement: 09/07/20 Potential to Achieve Goals: Good Additional Goals Additional Goal #1: Pt will achieve 0 to90 degrees R knee ROM for gait and stairs Progress towards PT goals: Progressing toward goals    Frequency    7X/week      PT Plan Current plan remains appropriate    Co-evaluation              AM-PAC PT "6 Clicks" Mobility   Outcome Measure  Help needed turning from your back to your side while in a flat bed without using bedrails?: None Help needed moving from lying on your back to sitting on the side of a flat bed without using bedrails?: None Help needed moving to and from a bed to a chair (including a wheelchair)?: A Little Help needed standing up from a chair using your arms (e.g., wheelchair or bedside chair)?: A Little Help needed to walk in hospital room?: A Little Help needed climbing 3-5 steps with a railing? : A Little 6 Click Score: 20    End of Session Equipment Utilized During Treatment: Gait belt Activity Tolerance: Patient tolerated treatment well Patient left: with call bell/phone within reach;in bed;with bed alarm set Nurse Communication: Mobility status (clear for d/c from PT perspective) PT Visit Diagnosis: Other abnormalities of gait and mobility (R26.89);Muscle weakness (generalized) (M62.81)     Time: 7782-4235 PT Time Calculation (min) (ACUTE ONLY): 23 min  Charges:  $Gait Training: 8-22 mins $Therapeutic Exercise: 8-22 mins                     Anise Salvo, PT Acute Rehab Services Pager [phone] [facility]  [phone]     Rayetta Humphrey 08/25/2020, 3:12 PM

## 2020-08-25 NOTE — Progress Notes (Signed)
Subjective: 1 Day Post-Op Procedure(s) (LRB): RIGHT TOTAL KNEE ARTHROPLASTY (Right) Patient reports pain as moderate.    Objective: Vital signs in last 24 hours: Temp:  [97.7 F (36.5 C)-98.5 F (36.9 C)] 98.4 F (36.9 C) (04/20 0803) Pulse Rate:  [54-69] 60 (04/20 0803) Resp:  [9-17] 16 (04/20 0803) BP: (110-160)/(56-74) 110/56 (04/20 0803) SpO2:  [92 %-100 %] 95 % (04/20 0803) Weight:  [59 kg] 59 kg (04/19 1137)  Intake/Output from previous day: 04/19 0701 - 04/20 0700 In: 1200 [I.V.:1200] Out: 760 [Urine:750; Blood:10] Intake/Output this shift: No intake/output data recorded.  Recent Labs    08/24/20 1317 08/25/20 0337  HGB 12.2 12.1   Recent Labs    08/24/20 1317 08/25/20 0337  WBC 8.8 15.9*  RBC 3.66* 3.55*  HCT 38.4 36.0  PLT 235 228   Recent Labs    08/24/20 1317 08/25/20 0337  NA 138 135  K 4.0 4.2  CL 107 104  CO2 23 23  BUN 17 16  CREATININE 0.71 0.71  GLUCOSE 93 127*  CALCIUM 8.7* 8.5*   No results for input(s): LABPT, INR in the last 72 hours.  Sensation intact distally Intact pulses distally Dorsiflexion/Plantar flexion intact Incision: dressing C/D/I Compartment soft   Assessment/Plan: 1 Day Post-Op Procedure(s) (LRB): RIGHT TOTAL KNEE ARTHROPLASTY (Right) Up with therapy Discharge home with home health hopefully this afternoon.      Kathryne Hitch 08/25/2020, 8:03 AM

## 2020-08-25 NOTE — Discharge Summary (Signed)
Patient ID: Jessica Hardy MRN: [id number] DOB/AGE: [birth year] 70 y.o.  Admit date: 08/24/2020 Discharge date: 08/25/2020  Admission Diagnoses:  Principal Problem:   Primary osteoarthritis of right knee Active Problems:   Status post total right knee replacement   Discharge Diagnoses:  Same  Past Medical History:  Diagnosis Date  . Arthritis    knee  . 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 female who was admitted 08/24/2020 for operative treatment ofPrimary osteoarthritis of right knee. Patient has severe unremitting pain that affects sleep, daily activities, and work/hobbies. After pre-op clearance the patient was taken to the operating room on 08/24/2020 and underwent  Procedure(s): RIGHT TOTAL KNEE ARTHROPLASTY.    Patient was given perioperative antibiotics:  Anti-infectives (From admission, onward)   Start     Dose/Rate Route Frequency Ordered Stop   08/24/20 2115  ceFAZolin (ANCEF) IVPB 1 g/50 mL premix        1 g 100 mL/hr over 30 Minutes Intravenous Every 6 hours 08/24/20 2019 08/25/20 0407   08/24/20 1148  ceFAZolin (ANCEF) 2-4 GM/100ML-% IVPB       Note to Pharmacy: [place], [place]   : cabinet override      08/24/20 1148 08/24/20 2359   08/24/20 1145  ceFAZolin (ANCEF) IVPB 2g/100 mL premix        2 g 200 mL/hr over 30 Minutes Intravenous On call to O.R. 08/24/20 1135 08/24/20 1500       Patient was given sequential compression devices, early ambulation, and chemoprophylaxis to prevent DVT.  Patient benefited maximally from hospital stay and there were no complications.    Recent vital signs:  Patient Vitals for the past 24 hrs:  BP Temp Temp src Pulse Resp SpO2 Height Weight  08/25/20 0803 (!) 110/56 98.4 F (36.9 C) Oral 60 16 95 % -- --  08/25/20 0332 116/62 -- -- (!) 57 16 94 %  -- --  08/24/20 2023 131/66 97.9 F (36.6 C) Oral (!) 55 16 95 % -- --  08/24/20 1948 125/63 97.9 F (36.6 C) -- 60 17 92 % -- --  08/24/20 1918 133/74 -- -- (!) 58 15 92 % -- --  08/24/20 1848 128/66 -- -- (!) 56 (!) 9 94 % -- --  08/24/20 1833 128/65 -- -- (!) 54 14 94 % -- --  08/24/20 1820 129/66 -- -- 61 16 96 % -- --  08/24/20 1803 137/68 -- -- (!) 58 13 93 % -- --  08/24/20 1748 135/67 -- -- -- 12 94 % -- --  08/24/20 1718 127/71 -- -- 65 13 94 % -- --  08/24/20 1703 125/69 -- -- 69 14 93 % -- --  08/24/20 1648 121/67 97.7 F (36.5 C) -- 68 14 95 % -- --  08/24/20 1455 (!) 128/58 -- -- (!) 57 12 98 % -- --  08/24/20 1450 (!) 125/56 -- -- (!) 56 14 98 % -- --  08/24/20 1445 (!) 129/56 -- -- 60 14 99 % -- --  08/24/20 1440 127/62 -- -- -- -- -- -- --  08/24/20 1435 130/66 -- -- (!) 57 13 98 % -- --  08/24/20 1430 (!) 160/71 -- -- -- -- -- -- --  08/24/20 1425 (!) 154/71 -- -- (!) 57 16 100 % -- --  08/24/20 1137 (!) 160/71 98.5 F (36.9 C) Oral 60 17 100 % 5\' 3"  (1.6 m) 59 kg     Recent laboratory studies:  Recent Labs    08/24/20 1317 08/25/20 0337  WBC 8.8 15.9*  HGB 12.2 12.1  HCT 38.4 36.0  PLT 235 228  NA 138 135  K 4.0 4.2  CL 107 104  CO2 23 23  BUN 17 16  CREATININE 0.71 0.71  GLUCOSE 93 127*  CALCIUM 8.7* 8.5*     Discharge Medications:   Allergies as of 08/25/2020   No Known Allergies     Medication List    TAKE these medications   acyclovir 800 MG tablet Commonly known as: ZOVIRAX Take 800 mg by mouth 5 (five) times daily as needed (cold sores).   aspirin 81 MG chewable tablet Chew 1 tablet (81 mg total) by mouth 2 (two) times daily.   cholecalciferol 25 MCG (1000 UNIT) tablet Commonly known as: VITAMIN D3 Take 1,000 Units by mouth daily.   Digestive Enzyme Caps Take 1 capsule by mouth daily. Pre/Probiotic   estradiol 0.5 MG tablet Commonly known as: ESTRACE Take 0.5 mg by mouth daily.   FLUoxetine 20 MG capsule Commonly known  as: PROZAC Take 20 mg by mouth at bedtime.   meloxicam 15 MG tablet Commonly known as: MOBIC Take 15 mg by mouth daily.   oxyCODONE 5 MG immediate release tablet Commonly known as: Oxy IR/ROXICODONE Take 1-2 tablets (5-10 mg total) by mouth every 6 (six) hours as needed for moderate pain (pain score 4-6).   simvastatin 40 MG tablet Commonly known as: ZOCOR Take 40 mg by mouth at bedtime.   tiZANidine 2 MG tablet Commonly known as: ZANAFLEX Take 1 tablet (2 mg total) by mouth every 6 (six) hours as needed for muscle spasms.            Durable Medical Equipment  (From admission, onward)         Start     Ordered   08/24/20 2020  DME 3 n 1  Once        08/24/20 2019   08/24/20 2020  DME Walker rolling  Once       Question Answer Comment  Walker: With 5 Inch Wheels   Patient needs a walker to treat with the following condition Status post total right knee replacement      08/24/20 2019          Diagnostic Studies: DG Knee Right Port  Result Date: 08/24/2020 CLINICAL DATA:  Postop total knee arthroplasty EXAM: PORTABLE RIGHT KNEE - 1-2 VIEW COMPARISON:  07/14/2020 FINDINGS: Changes of right knee replacement. No hardware complicating feature. Soft tissue and joint space gas present. IMPRESSION: Right knee replacement.  No visible complicating feature Electronically Signed   By: 09/13/2020 M.D.   On: 08/24/2020 19:31    Disposition: Discharge disposition: 01-Home or Self Care          Follow-up Information    08/26/2020, MD Follow up in 2 week(s).   Specialty: Orthopedic Surgery Contact information: [address] [place] [phone]                Signed: [phone] 08/25/2020, 11:09 AM

## 2020-08-25 NOTE — Plan of Care (Signed)
  Problem: Education: Goal: Knowledge of General Education information will improve Description: Including pain rating scale, medication(s)/side effects and non-pharmacologic comfort measures Outcome: Adequate for Discharge   Problem: Health Behavior/Discharge Planning: Goal: Ability to manage health-related needs will improve Outcome: Adequate for Discharge   Problem: Clinical Measurements: Goal: Ability to maintain clinical measurements within normal limits will improve Outcome: Adequate for Discharge Goal: Will remain free from infection Outcome: Adequate for Discharge Goal: Diagnostic test results will improve Outcome: Adequate for Discharge Goal: Respiratory complications will improve Outcome: Adequate for Discharge Goal: Cardiovascular complication will be avoided Outcome: Adequate for Discharge   Problem: Activity: Goal: Risk for activity intolerance will decrease Outcome: Adequate for Discharge   Problem: Nutrition: Goal: Adequate nutrition will be maintained Outcome: Adequate for Discharge   Problem: Coping: Goal: Level of anxiety will decrease Outcome: Adequate for Discharge   Problem: Elimination: Goal: Will not experience complications related to bowel motility Outcome: Adequate for Discharge Goal: Will not experience complications related to urinary retention Outcome: Adequate for Discharge   Problem: Pain Managment: Goal: General experience of comfort will improve Outcome: Adequate for Discharge   Problem: Safety: Goal: Ability to remain free from injury will improve Outcome: Adequate for Discharge   Problem: Skin Integrity: Goal: Risk for impaired skin integrity will decrease Outcome: Adequate for Discharge   Problem: Acute Rehab PT Goals(only PT should resolve) Goal: Patient Will Transfer Sit To/From Stand Outcome: Adequate for Discharge Goal: Pt Will Transfer Bed To Chair/Chair To Bed Outcome: Adequate for Discharge Goal: Pt Will  Ambulate Outcome: Adequate for Discharge Goal: Pt Will Go Up/Down Stairs Outcome: Adequate for Discharge Goal: PT Additional Goal #1 Outcome: Adequate for Discharge

## 2020-08-25 NOTE — Evaluation (Signed)
Physical Therapy Evaluation Patient Details Name: Jessica Hardy MRN: [id number] DOB: July 18, 1950 Today's Date: 08/25/2020   History of Present Illness  Pt is 70 yo female s/p R TKA on 08/24/20.  She has PMH including OA and  hearing loss  Clinical Impression  Pt is s/p TKA resulting in the deficits listed below (see PT Problem List). Pt seen on POD #1 for PT evaluation.  She does present with mild quad weakness and instability in standing (extensor lag ~10 degrees) but does compensate well with RW.  Would benefit from further PT prior to d/c for stair training and further assessment for need of continued use of knee immobilizer.  Very motivated and active at baseline - excellent rehab potential.  Has DME and excellent support at home. Pt's boyfriend has prepped house well for her post-op. Pt will benefit from skilled PT to increase their independence and safety with mobility to allow discharge to the venue listed below.      Follow Up Recommendations Follow surgeon's recommendation for DC plan and follow-up therapies;Supervision/Assistance - 24 hour    Equipment Recommendations  None recommended by PT    Recommendations for Other Services       Precautions / Restrictions Precautions Precautions: Fall Required Braces or Orthoses: Knee Immobilizer - Right Knee Immobilizer - Right: Discontinue once straight leg raise with < 10 degree lag Restrictions Weight Bearing Restrictions: Yes RLE Weight Bearing: Weight bearing as tolerated      Mobility  Bed Mobility Overal bed mobility: Needs Assistance Bed Mobility: Supine to Sit     Supine to sit: Supervision          Transfers Overall transfer level: Needs assistance Equipment used: Rolling walker (2 wheeled) Transfers: Sit to/from Stand Sit to Stand: Min guard         General transfer comment: Cues for safe hand placement, R LE managment, and slower transfers.  Pt standing quickly on L leg , cued for gradual weight on R LE.  Cued for safe sitting technique  Ambulation/Gait Ambulation/Gait assistance: Min guard;Min assist Gait Distance (Feet): 30 Feet Assistive device: Rolling walker (2 wheeled) Gait Pattern/deviations: Decreased weight shift to right;Step-to pattern;Decreased stride length Gait velocity: decreased   General Gait Details: Pt with mild R knee instability but compensates well with RW and step to pattern.  Cued for sequence and RW proximity.  Had 1 LOB when backing to toielt requiring min assist.  Stairs            Wheelchair Mobility    Modified Rankin (Stroke Patients Only)       Balance Overall balance assessment: Needs assistance Sitting-balance support: No upper extremity supported Sitting balance-Leahy Scale: Normal     Standing balance support: Bilateral upper extremity supported;No upper extremity supported Standing balance-Leahy Scale: Fair Standing balance comment: RW for ambulation.  Able to balance on L leg and wash hands                             Pertinent Vitals/Pain Pain Assessment: No/denies pain    Home Living Family/patient expects to be discharged to:: Private residence Living Arrangements: Spouse/significant other (boyfriend) Available Help at Discharge: Friend(s);Available 24 hours/day Type of Home: House Home Access: Stairs to enter Entrance Stairs-Rails: None Entrance Stairs-Number of Steps: 1 step onto porch Home Layout: One level Home Equipment: Grab bars - toilet;Grab bars - tub/shower;Shower seat;Walker - 2 wheels Additional Comments: Boyfriend has prepped house and put "grab  bars everywhere" - by door, toilet (both sides), shower and made pt practice getting OOB with 1 leg    Prior Function Level of Independence: Independent         Comments: Pt completely independent driving, community ambulation, ADLs, and IADLs - just limited by pain     Hand Dominance        Extremity/Trunk Assessment   Upper Extremity  Assessment Upper Extremity Assessment: Overall WFL for tasks assessed    Lower Extremity Assessment Lower Extremity Assessment: LLE deficits/detail;RLE deficits/detail RLE Deficits / Details: Expected post op changes; does still have mild extensor lag (~10 degrees); ROM : ankle and hip WFL, knee 5to 90 degrees; MMT: ankle 5/5, hip 3/5, knee 2/5 LLE Deficits / Details: WFL; MMT 5/5    Cervical / Trunk Assessment Cervical / Trunk Assessment: Normal  Communication   Communication: HOH  Cognition Arousal/Alertness: Awake/alert Behavior During Therapy: WFL for tasks assessed/performed Overall Cognitive Status: Within Functional Limits for tasks assessed                                        General Comments   Educated on safe ice use, no pivots, car transfers, resting with leg straight, and TED hose during day. Also, encouraged walking every 1-2 hours during day. Educated on HEP with focus on mobility the first weeks. Discussed doing exercises within pain control and if pain increasing could decreased ROM, reps, and stop exercises as needed. Encouraged to perform quad sets and ankle pumps frequently for blood flow and to promote full knee extension.    Exercises Total Joint Exercises Ankle Circles/Pumps: AROM;Both;10 reps;Supine Quad Sets: AROM;Both;10 reps;Supine Towel Squeeze: AROM;Both;10 reps;Supine Heel Slides: AAROM;Right;10 reps;Supine Hip ABduction/ADduction: 10 reps;Right;Supine;AROM Long Arc Quad: AROM;Right;10 reps;Supine Knee Flexion: AROM;Right;10 reps;Supine Goniometric ROM: R knee 5 to 90 degrees   Assessment/Plan    PT Assessment Patient needs continued PT services  PT Problem List Decreased strength;Decreased mobility;Decreased safety awareness;Decreased range of motion;Decreased activity tolerance;Decreased balance;Decreased knowledge of use of DME       PT Treatment Interventions DME instruction;Therapeutic activities;Gait training;Therapeutic  exercise;Patient/family education;Stair training;Balance training;Functional mobility training;Modalities    PT Goals (Current goals can be found in the Care Plan section)  Acute Rehab PT Goals Patient Stated Goal: return home PT Goal Formulation: With patient Time For Goal Achievement: 09/07/20 Potential to Achieve Goals: Good Additional Goals Additional Goal #1: Pt will achieve 0 to90 degrees R knee ROM for gait and stairs    Frequency 7X/week   Barriers to discharge        Co-evaluation               AM-PAC PT "6 Clicks" Mobility  Outcome Measure Help needed turning from your back to your side while in a flat bed without using bedrails?: A Little Help needed moving from lying on your back to sitting on the side of a flat bed without using bedrails?: A Little Help needed moving to and from a bed to a chair (including a wheelchair)?: A Little Help needed standing up from a chair using your arms (e.g., wheelchair or bedside chair)?: A Little Help needed to walk in hospital room?: A Little Help needed climbing 3-5 steps with a railing? : A Little 6 Click Score: 18    End of Session Equipment Utilized During Treatment: Gait belt Activity Tolerance: Patient tolerated treatment well Patient left: with call  bell/phone within reach;with chair alarm set;in chair Nurse Communication: Mobility status PT Visit Diagnosis: Other abnormalities of gait and mobility (R26.89);Muscle weakness (generalized) (M62.81)    Time: 1610-9604 PT Time Calculation (min) (ACUTE ONLY): 36 min   Charges:   PT Evaluation $PT Eval Moderate Complexity: 1 Mod PT Treatments $Therapeutic Exercise: 8-22 mins        Anise Salvo, PT Acute Rehab Services Pager [phone] [facility] [phone]    Rayetta Humphrey 08/25/2020, 11:33 AM

## 2020-08-26 LAB — TYPE AND SCREEN
ABO/RH(D): O POS
Antibody Screen: POSITIVE
Unit division: 0
Unit division: 0

## 2020-08-26 LAB — BPAM RBC
Blood Product Expiration Date: 202205162359
Blood Product Expiration Date: 202205162359
Unit Type and Rh: 5100
Unit Type and Rh: 5100

## 2020-08-27 ENCOUNTER — Telehealth: Payer: Self-pay

## 2020-08-27 NOTE — Telephone Encounter (Signed)
Verbal order given  

## 2020-08-27 NOTE — Telephone Encounter (Signed)
Maricsa from [facility] called she is requesting verbal orders with a frequency of 3w2w and 2w2w for strengthening and gait training Jaziya is also asking is patient can start taking acyclovir and tizanidine together call [phone]

## 2020-08-30 ENCOUNTER — Other Ambulatory Visit: Payer: Self-pay | Admitting: Orthopaedic Surgery

## 2020-09-01 ENCOUNTER — Other Ambulatory Visit: Payer: Self-pay | Admitting: Orthopaedic Surgery

## 2020-09-01 NOTE — Telephone Encounter (Signed)
Right TKA 08/24/20

## 2020-09-03 ENCOUNTER — Other Ambulatory Visit: Payer: Self-pay | Admitting: Orthopaedic Surgery

## 2020-09-03 ENCOUNTER — Telehealth: Payer: Self-pay | Admitting: Orthopaedic Surgery

## 2020-09-03 MED ORDER — OXYCODONE HCL 5 MG PO TABS: 5.0000 mg | ORAL_TABLET | Freq: Four times a day (QID) | ORAL | 0 refills | Status: DC | PRN

## 2020-09-03 NOTE — Telephone Encounter (Signed)
Just sent some in

## 2020-09-03 NOTE — Telephone Encounter (Signed)
Pt called asking for a refill of her oxycodone 5 mg rx and would like to be notified when it's sent to the [facility] pharmacy.   [phone]

## 2020-09-03 NOTE — Telephone Encounter (Signed)
Please advise 

## 2020-09-06 ENCOUNTER — Ambulatory Visit (INDEPENDENT_AMBULATORY_CARE_PROVIDER_SITE_OTHER): Payer: Medicare Other | Admitting: Orthopaedic Surgery

## 2020-09-06 ENCOUNTER — Encounter: Payer: Self-pay | Admitting: Orthopaedic Surgery

## 2020-09-06 DIAGNOSIS — Z96651 Presence of right artificial knee joint: Secondary | ICD-10-CM

## 2020-09-06 NOTE — Progress Notes (Signed)
The patient is a 70 year old female who is 2 weeks tomorrow status post a right total knee arthroplasty.  She says she is doing well.  She ambulates without assist device and says she has been able to flex her knee to about 90 degrees.  Home health therapy has been helpful.  She does live in [place].  She understands that I would like to transition her to outpatient physical therapy.  I did give her a prescription for seeking outpatient [place].  Examination of the right knee shows a well-healed surgical incision.  The staples have been removed and Steri-Strips applied.  Her calf is soft.  She has been on an aspirin twice a day.  I will have her go down to an aspirin once a day for a week and then she can stop the aspirin.  When she runs low on oxycodone she knows to call us because I will happily refill this to get her through therapy to get her knee bending better.  I was able to see that she has almost full extension and I can flex her to about 85 degrees.  We will see her back in 4 weeks to see how she is doing overall.  No x-rays are needed.  All questions and concerns were answered and addressed.

## 2020-09-13 ENCOUNTER — Telehealth: Payer: Self-pay

## 2020-09-13 ENCOUNTER — Other Ambulatory Visit: Payer: Self-pay | Admitting: Orthopaedic Surgery

## 2020-09-13 MED ORDER — OXYCODONE HCL 5 MG PO TABS: 5.0000 mg | ORAL_TABLET | Freq: Four times a day (QID) | ORAL | 0 refills | Status: DC | PRN

## 2020-09-13 NOTE — Telephone Encounter (Signed)
Patient called she is requesting a rx refill for oxycodone call back:[phone]

## 2020-09-13 NOTE — Telephone Encounter (Signed)
Please advise 

## 2020-09-28 ENCOUNTER — Other Ambulatory Visit: Payer: Self-pay | Admitting: Orthopaedic Surgery

## 2020-10-05 ENCOUNTER — Ambulatory Visit (INDEPENDENT_AMBULATORY_CARE_PROVIDER_SITE_OTHER): Payer: Medicare Other | Admitting: Orthopaedic Surgery

## 2020-10-05 ENCOUNTER — Encounter: Payer: Self-pay | Admitting: Orthopaedic Surgery

## 2020-10-05 DIAGNOSIS — Z96651 Presence of right artificial knee joint: Secondary | ICD-10-CM

## 2020-10-05 MED ORDER — DOXYCYCLINE HYCLATE 100 MG PO TABS: 100.0000 mg | ORAL_TABLET | Freq: Two times a day (BID) | ORAL | 0 refills | Status: DC

## 2020-10-05 MED ORDER — OXYCODONE HCL 5 MG PO TABS: 5.0000 mg | ORAL_TABLET | Freq: Four times a day (QID) | ORAL | 0 refills | Status: DC | PRN

## 2020-10-05 NOTE — Progress Notes (Signed)
The patient is 6-week status post a right total knee arthroplasty.  She says that her range of motion has improved significantly.  There is still popping in the knee and it hurts more at night but overall she is doing well.  She does need a refill of her pain medications.  At her last visit I was able to flex her knee to about 85 degrees.  Examination of her right operative knee today shows almost full extension to well past 100 degrees flexion.  There is no effusion.  There is some slight redness at the central portion of her incision and there was an area that had had some small dehiscence.  I have instructed her to place Bactroban ointment on this wound daily and to keep it clean and dry otherwise.  I will send in some doxycycline.  This does not look worrisome but we still would like to see her back in just 2 weeks to assess her incision again.  I did send in some more oxycodone.  All questions and concerns were answered and addressed

## 2020-10-06 ENCOUNTER — Other Ambulatory Visit: Payer: Self-pay | Admitting: Orthopaedic Surgery

## 2020-10-19 ENCOUNTER — Encounter: Payer: Self-pay | Admitting: Orthopaedic Surgery

## 2020-10-19 ENCOUNTER — Ambulatory Visit (INDEPENDENT_AMBULATORY_CARE_PROVIDER_SITE_OTHER): Payer: Medicare Other | Admitting: Orthopaedic Surgery

## 2020-10-19 DIAGNOSIS — Z96651 Presence of right artificial knee joint: Secondary | ICD-10-CM

## 2020-10-19 NOTE — Progress Notes (Signed)
The patient is now about 8 weeks out from a total knee arthroplasty of her right knee.  2 weeks ago when I saw her I was concerned about a little area in the central aspect of the incision with a slight dehiscence.  She has been placing Bactroban ointment on this daily and we have her on doxycycline.  She reports that she is doing better overall and reports good motion and strength.  She denies any fever chills.  Examination of her right operative knee shows no redness.  The swelling is minimal.  The central aspect of the wound is just a very small opening and there is some slight serous drainage from this area.  This may be reflective of irritation from the suture.  I do not specifically see a suture.  I would like her to continue her antibiotics and the Bactroban ointment daily.  I would still like to see this again in 2 weeks to make sure that we are staying on top of this and in the right direction.  She still denies any significant pain at all.  She does have a dental appointment upcoming for a cleaning and I told her to reschedule out for 6 weeks.

## 2020-10-22 ENCOUNTER — Other Ambulatory Visit: Payer: Self-pay | Admitting: Orthopaedic Surgery

## 2020-10-22 NOTE — Telephone Encounter (Signed)
ok 

## 2020-10-29 ENCOUNTER — Other Ambulatory Visit: Payer: Self-pay | Admitting: Orthopaedic Surgery

## 2020-10-29 NOTE — Telephone Encounter (Signed)
ok 

## 2020-11-01 ENCOUNTER — Other Ambulatory Visit: Payer: Self-pay

## 2020-11-01 ENCOUNTER — Ambulatory Visit (INDEPENDENT_AMBULATORY_CARE_PROVIDER_SITE_OTHER): Payer: Medicare Other | Admitting: Orthopaedic Surgery

## 2020-11-01 ENCOUNTER — Encounter: Payer: Self-pay | Admitting: Orthopaedic Surgery

## 2020-11-01 DIAGNOSIS — Z96651 Presence of right artificial knee joint: Secondary | ICD-10-CM

## 2020-11-01 NOTE — Progress Notes (Signed)
The patient is now 10 weeks out from a right total knee arthroplasty.  She is doing very well overall.  I was concerned about a small wound at the central part of her incision.  That looks great today.  There is no drainage and no redness.  There is no knee joint effusion.  She reports decreased pain as well as improve range of motion of that knee.  She is walking without assistive device.  The right knee is examined today has minimal swelling.  Her range of motion is almost entirely full with the right knee and it feels ligamentously stable.  She will continue increase activities as comfort allows.  She can stop outpatient physical therapy and can just resume a home exercise program.  We will see her back in 3 months with an AP and lateral of the right knee.  All questions and concerns were answered and addressed.

## 2020-11-10 ENCOUNTER — Other Ambulatory Visit: Payer: Self-pay | Admitting: Orthopaedic Surgery

## 2020-11-21 ENCOUNTER — Other Ambulatory Visit: Payer: Self-pay | Admitting: Orthopaedic Surgery

## 2020-11-22 NOTE — Telephone Encounter (Signed)
ok 

## 2020-12-03 ENCOUNTER — Other Ambulatory Visit: Payer: Self-pay | Admitting: Orthopaedic Surgery

## 2020-12-15 ENCOUNTER — Other Ambulatory Visit: Payer: Self-pay | Admitting: Orthopaedic Surgery

## 2020-12-24 ENCOUNTER — Other Ambulatory Visit: Payer: Self-pay | Admitting: Orthopaedic Surgery

## 2021-01-04 ENCOUNTER — Other Ambulatory Visit: Payer: Self-pay | Admitting: Orthopaedic Surgery

## 2021-01-04 NOTE — Telephone Encounter (Signed)
ok 

## 2021-01-21 ENCOUNTER — Other Ambulatory Visit: Payer: Self-pay | Admitting: Orthopaedic Surgery

## 2021-01-21 NOTE — Telephone Encounter (Signed)
ok 

## 2021-01-31 ENCOUNTER — Encounter: Payer: Self-pay | Admitting: Orthopaedic Surgery

## 2021-01-31 ENCOUNTER — Other Ambulatory Visit: Payer: Self-pay

## 2021-01-31 ENCOUNTER — Ambulatory Visit (INDEPENDENT_AMBULATORY_CARE_PROVIDER_SITE_OTHER): Payer: Medicare Other | Admitting: Orthopaedic Surgery

## 2021-01-31 ENCOUNTER — Ambulatory Visit: Payer: Self-pay

## 2021-01-31 DIAGNOSIS — Z96651 Presence of right artificial knee joint: Secondary | ICD-10-CM | POA: Diagnosis not present

## 2021-01-31 NOTE — Progress Notes (Signed)
She comes in today at 5 months status post a right total knee arthroplasty.  She has no complaints or concerns.  She says she cannot flex it fully all the way back yet.  On my exam her flexion is actually full and her extension is full of the right knee.  It feels ligamentously stable.  He does have good range of motion overall.  Incisions well-healed.  There is no effusion.  2 views of the right knee show well-seated total knee arthroplasty with no complicating features.  I would like to see her back 1 more time in 6 months since this is a press-fit implant.  At that visit we will have an AP and lateral right knee.

## 2021-02-08 ENCOUNTER — Other Ambulatory Visit: Payer: Self-pay | Admitting: Orthopaedic Surgery

## 2021-02-08 NOTE — Telephone Encounter (Signed)
ok 

## 2021-02-16 ENCOUNTER — Other Ambulatory Visit: Payer: Self-pay | Admitting: Orthopaedic Surgery

## 2021-02-16 NOTE — Telephone Encounter (Signed)
ok 

## 2021-02-28 ENCOUNTER — Other Ambulatory Visit: Payer: Self-pay | Admitting: Orthopaedic Surgery

## 2021-02-28 NOTE — Telephone Encounter (Signed)
ok 

## 2021-03-09 ENCOUNTER — Other Ambulatory Visit: Payer: Self-pay | Admitting: Orthopaedic Surgery

## 2021-03-21 ENCOUNTER — Other Ambulatory Visit: Payer: Self-pay | Admitting: Orthopaedic Surgery

## 2021-03-23 ENCOUNTER — Telehealth: Payer: Self-pay | Admitting: Orthopaedic Surgery

## 2021-03-23 ENCOUNTER — Other Ambulatory Visit: Payer: Self-pay | Admitting: Orthopaedic Surgery

## 2021-03-23 NOTE — Telephone Encounter (Signed)
Jessica Hardy from Dr. Loren Racer office called. Patient is at the dentist office. They need something in writing stating patient does not need antibiotic for a cleaning. The fax number is [phone]

## 2021-03-23 NOTE — Telephone Encounter (Signed)
Letter complete. Can you please fax this for me?

## 2021-03-23 NOTE — Telephone Encounter (Signed)
faxed

## 2021-04-02 ENCOUNTER — Other Ambulatory Visit: Payer: Self-pay | Admitting: Orthopaedic Surgery

## 2021-04-12 ENCOUNTER — Other Ambulatory Visit: Payer: Self-pay | Admitting: Orthopaedic Surgery

## 2021-04-22 ENCOUNTER — Other Ambulatory Visit: Payer: Self-pay | Admitting: Orthopaedic Surgery

## 2021-05-04 ENCOUNTER — Other Ambulatory Visit: Payer: Self-pay | Admitting: Orthopaedic Surgery

## 2021-05-16 ENCOUNTER — Other Ambulatory Visit: Payer: Self-pay | Admitting: Orthopaedic Surgery

## 2021-05-26 ENCOUNTER — Other Ambulatory Visit: Payer: Self-pay | Admitting: Orthopaedic Surgery

## 2021-06-08 ENCOUNTER — Other Ambulatory Visit: Payer: Self-pay | Admitting: Orthopaedic Surgery

## 2021-08-01 ENCOUNTER — Ambulatory Visit (INDEPENDENT_AMBULATORY_CARE_PROVIDER_SITE_OTHER): Payer: Medicare Other | Admitting: Orthopaedic Surgery

## 2021-08-01 ENCOUNTER — Encounter: Payer: Self-pay | Admitting: Orthopaedic Surgery

## 2021-08-01 ENCOUNTER — Ambulatory Visit (INDEPENDENT_AMBULATORY_CARE_PROVIDER_SITE_OTHER): Payer: Medicare Other

## 2021-08-01 DIAGNOSIS — Z96651 Presence of right artificial knee joint: Secondary | ICD-10-CM

## 2021-08-01 NOTE — Progress Notes (Signed)
The patient is 71 years old and getting close to 1 year status post a right total knee replacement.  She has no complaints with her right knee at all.  She says she still cannot get down on her knee in terms of squatting down on it but overall she is very satisfied and pleased and has no pain or complaints with her right total knee.  It does not swell and does not feel unstable to her. ? ?On exam there is no effusion of right knee.  Her range of motion is entirely full and the knee feels ligamentously stable. ? ?2 views of the right knee show a well-seated press-fit total knee arthroplasty with no evidence of loosening or complicating features. ? ?At this point follow-up for her knee can be as needed.  If there is any issues at all she knows to not hesitate to give [place] a call. ?

## 2021-12-27 IMAGING — DX DG KNEE 1-2V PORT*R*
4 series · 4 of 4 positions shown · non-contrast
Comparison: 07/14/2020

CLINICAL DATA: Postop total knee arthroplasty

EXAM:
PORTABLE RIGHT KNEE - 1-2 VIEW

[knee ap]
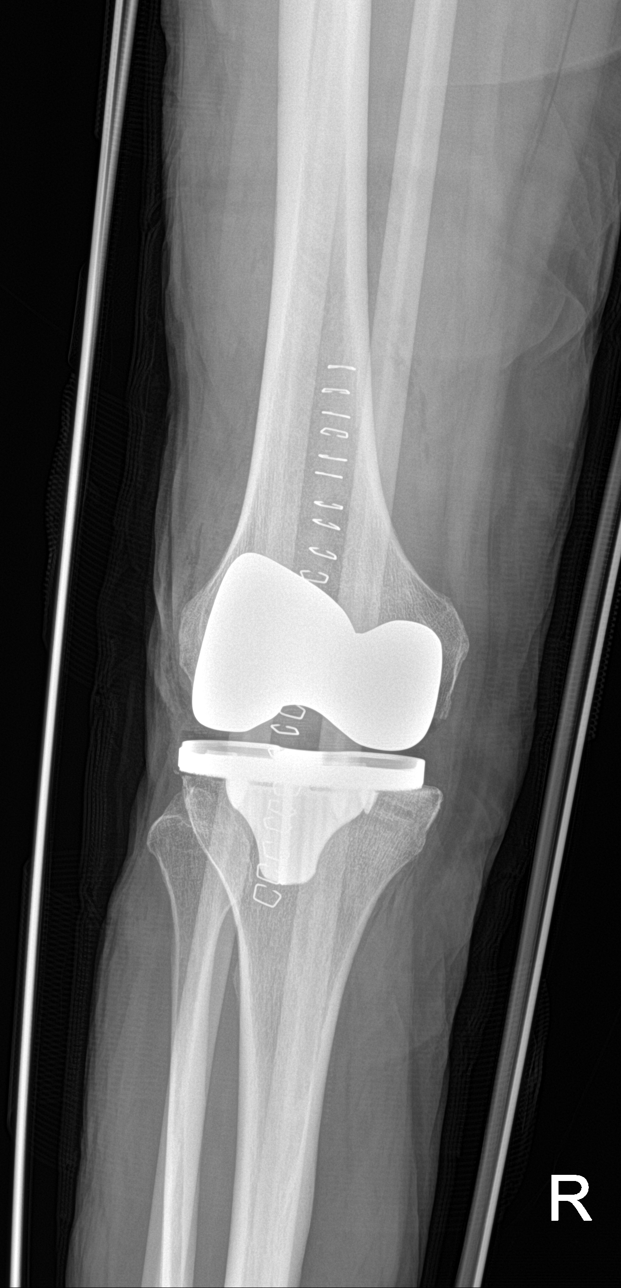

[knee lat]
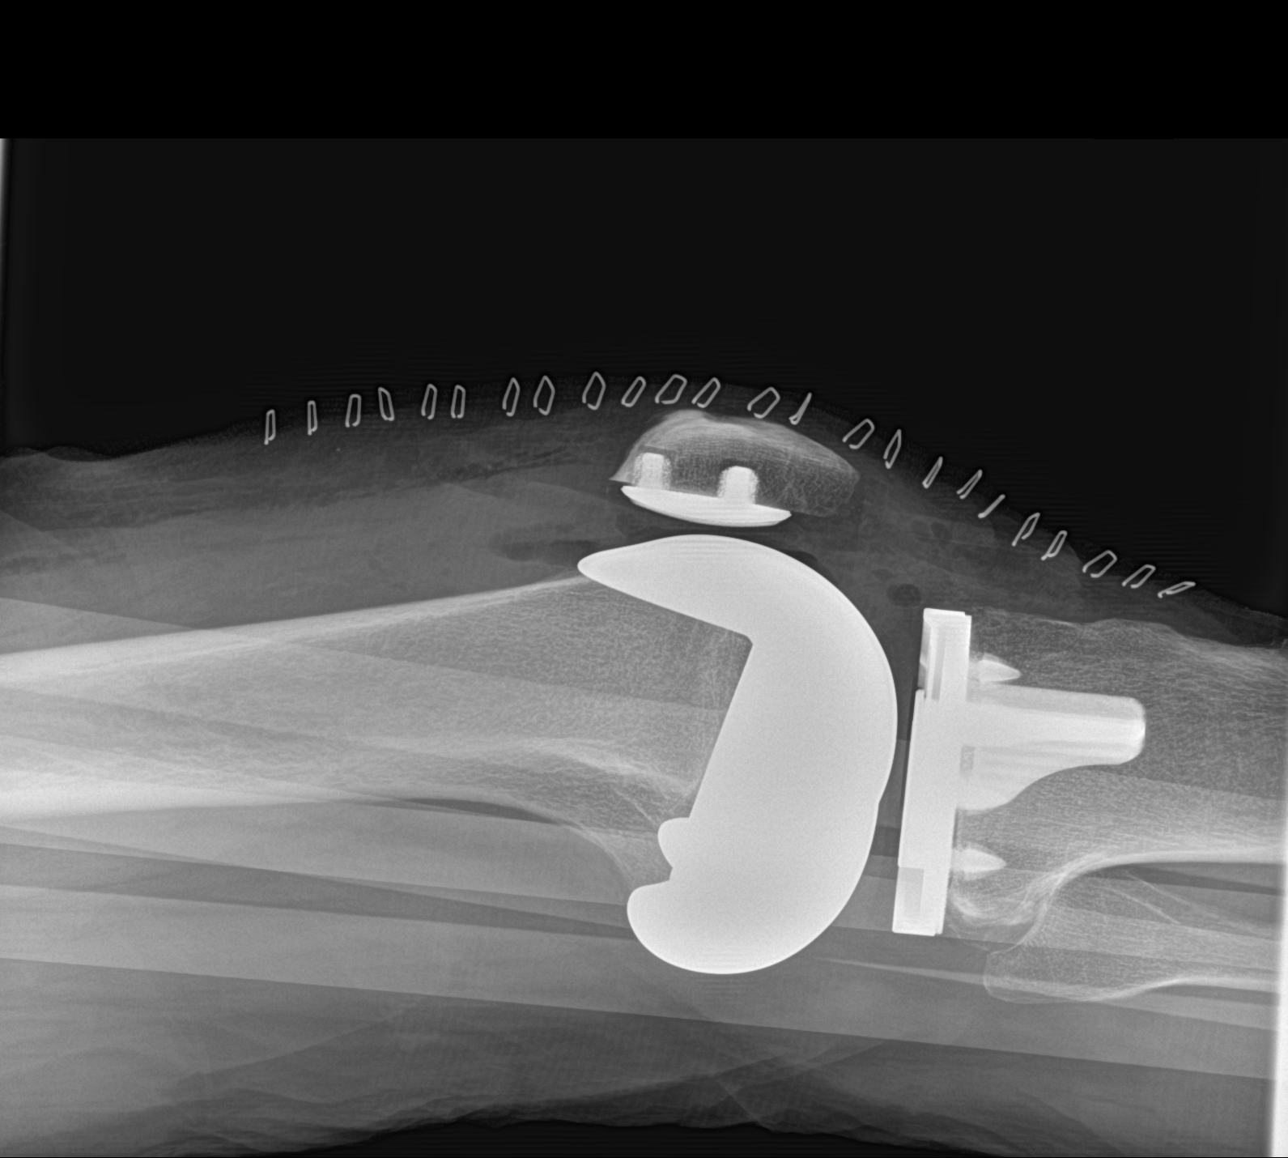

[knee obl (1 of 2)]
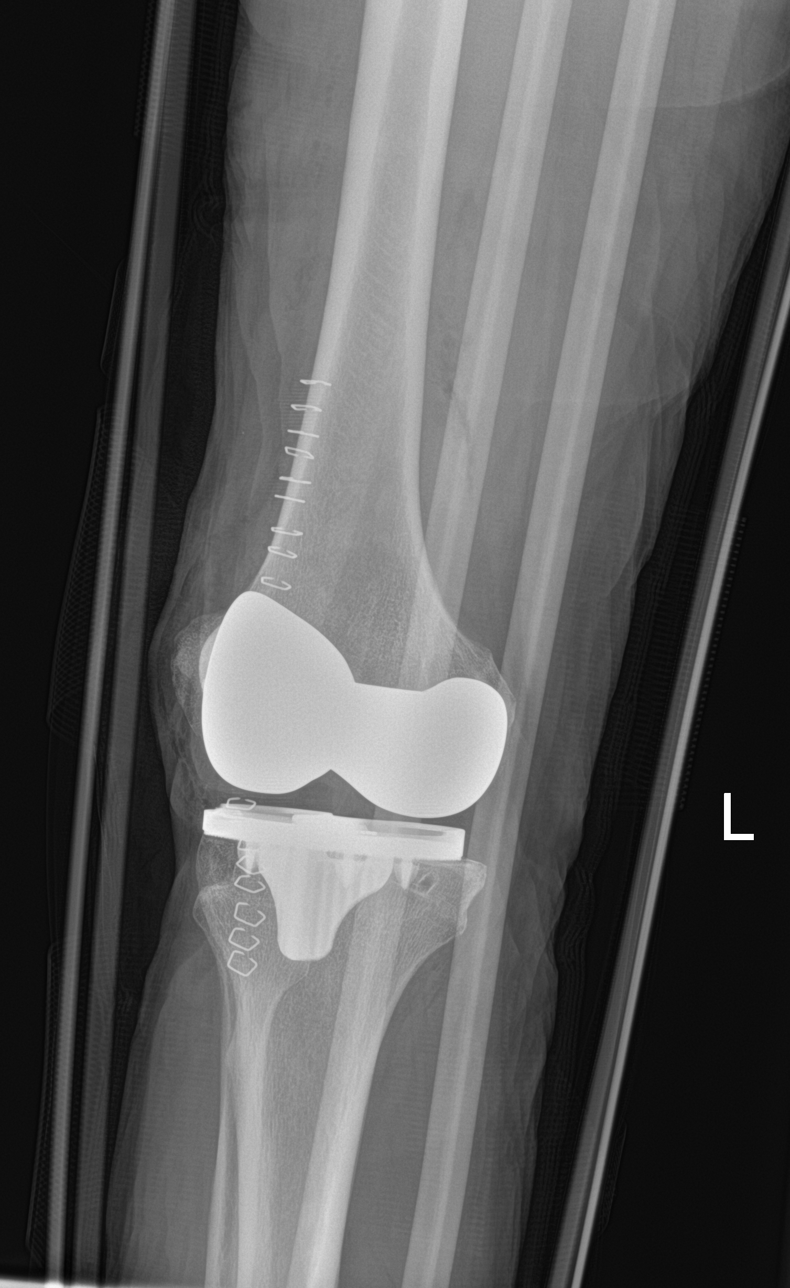

[knee obl (2 of 2)]
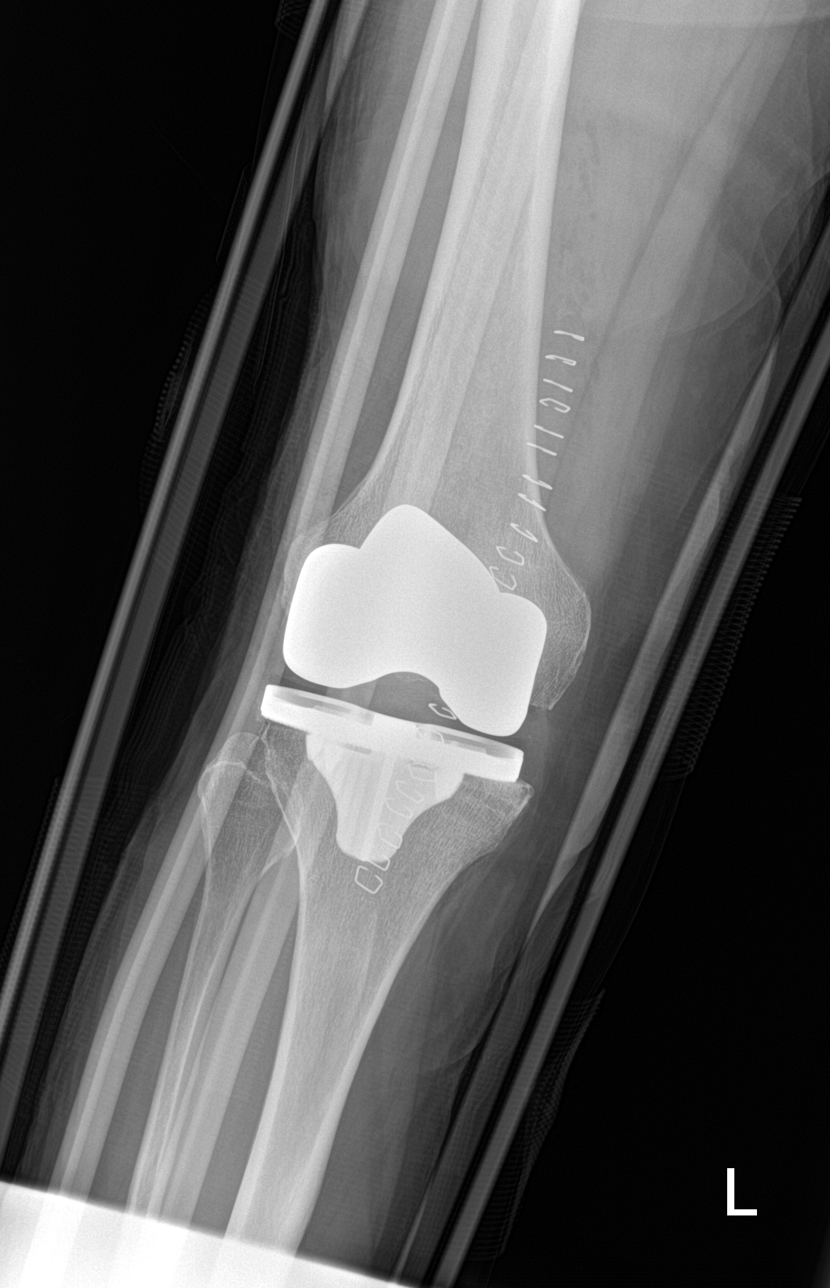

[4 of 4 positions shown; findings below may reference images not displayed]

FINDINGS: Changes of right knee replacement. No hardware complicating feature.
Soft tissue and joint space gas present.
IMPRESSION: Right knee replacement.  No visible complicating feature

## 2021-12-29 ENCOUNTER — Ambulatory Visit: Payer: Medicare Other | Admitting: Physician Assistant
